# Patient Record
Sex: Male | Born: 1945 | Race: White | Hispanic: No | Smoking: Former smoker
Health system: Southern US, Community
[De-identification: ages and names within clinical notes are randomized; demographics above are authoritative.]

## PROBLEM LIST (undated history)

## (undated) DIAGNOSIS — C801 Malignant (primary) neoplasm, unspecified: Secondary | ICD-10-CM

## (undated) DIAGNOSIS — I10 Essential (primary) hypertension: Secondary | ICD-10-CM

## (undated) DIAGNOSIS — I509 Heart failure, unspecified: Secondary | ICD-10-CM

## (undated) DIAGNOSIS — Z9989 Dependence on other enabling machines and devices: Secondary | ICD-10-CM

## (undated) DIAGNOSIS — F1011 Alcohol abuse, in remission: Secondary | ICD-10-CM

## (undated) DIAGNOSIS — R269 Unspecified abnormalities of gait and mobility: Secondary | ICD-10-CM

## (undated) DIAGNOSIS — E669 Obesity, unspecified: Secondary | ICD-10-CM

## (undated) DIAGNOSIS — I499 Cardiac arrhythmia, unspecified: Secondary | ICD-10-CM

## (undated) DIAGNOSIS — M1990 Unspecified osteoarthritis, unspecified site: Secondary | ICD-10-CM

## (undated) DIAGNOSIS — M545 Low back pain: Principal | ICD-10-CM

## (undated) DIAGNOSIS — N189 Chronic kidney disease, unspecified: Secondary | ICD-10-CM

## (undated) DIAGNOSIS — G4733 Obstructive sleep apnea (adult) (pediatric): Secondary | ICD-10-CM

## (undated) DIAGNOSIS — G8929 Other chronic pain: Principal | ICD-10-CM

## (undated) DIAGNOSIS — I350 Nonrheumatic aortic (valve) stenosis: Secondary | ICD-10-CM

## (undated) DIAGNOSIS — M21371 Foot drop, right foot: Secondary | ICD-10-CM

## (undated) DIAGNOSIS — E119 Type 2 diabetes mellitus without complications: Secondary | ICD-10-CM

## (undated) DIAGNOSIS — I4891 Unspecified atrial fibrillation: Secondary | ICD-10-CM

## (undated) DIAGNOSIS — E785 Hyperlipidemia, unspecified: Secondary | ICD-10-CM

## (undated) DIAGNOSIS — E1142 Type 2 diabetes mellitus with diabetic polyneuropathy: Secondary | ICD-10-CM

## (undated) HISTORY — DX: Essential (primary) hypertension: I10

## (undated) HISTORY — PX: DG THUMB LEFT HAND: HXRAD1658

## (undated) HISTORY — DX: Foot drop, right foot: M21.371

## (undated) HISTORY — DX: Alcohol abuse, in remission: F10.11

## (undated) HISTORY — DX: Type 2 diabetes mellitus with diabetic polyneuropathy: E11.42

## (undated) HISTORY — DX: Other chronic pain: G89.29

## (undated) HISTORY — DX: Obesity, unspecified: E66.9

## (undated) HISTORY — DX: Unspecified atrial fibrillation: I48.91

## (undated) HISTORY — PX: TOE SURGERY: SHX1073

## (undated) HISTORY — DX: Obstructive sleep apnea (adult) (pediatric): G47.33

## (undated) HISTORY — DX: Type 2 diabetes mellitus without complications: E11.9

## (undated) HISTORY — DX: Unspecified abnormalities of gait and mobility: R26.9

## (undated) HISTORY — DX: Dependence on other enabling machines and devices: Z99.89

## (undated) HISTORY — DX: Low back pain: M54.5

## (undated) HISTORY — PX: TONSILLECTOMY: SUR1361

## (undated) HISTORY — DX: Hyperlipidemia, unspecified: E78.5

## (undated) HISTORY — PX: KNEE SURGERY: SHX244

---

## 2005-08-05 ENCOUNTER — Emergency Department (HOSPITAL_COMMUNITY): Admission: EM | Admit: 2005-08-05 | Discharge: 2005-08-05 | Payer: Self-pay | Admitting: Emergency Medicine

## 2005-08-06 ENCOUNTER — Ambulatory Visit (HOSPITAL_BASED_OUTPATIENT_CLINIC_OR_DEPARTMENT_OTHER): Admission: RE | Admit: 2005-08-06 | Discharge: 2005-08-06 | Payer: Self-pay | Admitting: *Deleted

## 2005-08-06 ENCOUNTER — Ambulatory Visit (HOSPITAL_COMMUNITY): Admission: RE | Admit: 2005-08-06 | Discharge: 2005-08-06 | Payer: Self-pay | Admitting: *Deleted

## 2006-02-16 ENCOUNTER — Ambulatory Visit (HOSPITAL_COMMUNITY): Admission: RE | Admit: 2006-02-16 | Discharge: 2006-02-16 | Payer: Self-pay | Admitting: Internal Medicine

## 2007-12-01 ENCOUNTER — Ambulatory Visit (HOSPITAL_COMMUNITY): Admission: RE | Admit: 2007-12-01 | Discharge: 2007-12-01 | Payer: Self-pay | Admitting: Family Medicine
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
## 2011-05-09 NOTE — Op Note (Signed)
NAME:  Alejandro Vaughn, ROTHE NO.:  [id number]   MEDICAL RECORD NO.:  [id number]          PATIENT TYPE:  AMB   LOCATION:  DSC                          FACILITY:  [facility]   PHYSICIAN:  Tennis Must Meyerdierks, M.D.DATE OF BIRTH:  1946/08/17   DATE OF PROCEDURE:  08/06/2005  DATE OF DISCHARGE:                                 OPERATIVE REPORT   PREOPERATIVE DIAGNOSIS:  Comminuted intra-articular fracture, proximal  phalanx left thumb.   POSTOPERATIVE DIAGNOSIS:  Comminuted intra-articular fracture, proximal  phalanx left thumb.   PROCEDURE:  Open reduction internal fixation, proximal phalanx left thumb.   SURGEON:  Lowell Bouton, M.D.   ANESTHESIA:  General.   OPERATIVE FINDINGS:  The patient had a comminuted fracture that was oblique  in the mid shaft of the proximal phalanx. There was an intra-articular  component into the MP joint.   PROCEDURE:  Under general anesthesia with a tourniquet on the left arm, the  left hand was prepped and draped in usual fashion; after explaining the  limb, the tourniquet was inflated to 250 mmHg. A longitudinal incision was  made dorsally over the thumb proximal phalanx. Sharp dissection was carried  through the subcutaneous tissues and bleeding points were coagulated. Sharp  dissection was carried down through the extensor tendon, which was split  longitudinally. Periosteum was elevated with a Administrator, [employer]  spring retractors were inserted. The fracture site was identified and a  curet was used to clean the ends of the fracture. The fracture was irrigated  and suctioned out. The oblique component of the fracture showed a fair  amount of comminution. The incision was then extended proximally to the MP  joint, to allow visualization of the joint. The fracture in the joint was  reduced and a 2.0-mm lag screw was lagged across the fracture from radial to  ulnar. X-rays showed good position of the articular fracture.  It appeared to  be anatomically reduced, looking under direct vision into the joint. The  oblique portion of the fracture was then reduced and held with a clamp, and  a plate from the modular handset (that measured a 2.0 mm) was applied to the  dorsum. Some of the holes were cut off, as the plate was too long. The  distal part of the plate had two screws transversely, and the plate was  contoured to fit the bone. Five screws were then inserted, with two in the  distal fragment and three in the proximal fragment; getting good fixation.  The 2.0 mm screws were used. X-rays showed good alignment. There was some  bone loss due to the comminution at the fracture. Under fluoroscopy, the  fracture was stable with range of motion of the MP and IP joint. The wound  was then irrigated copiously with saline.  Marcaine 0.5% was inserted into  the skin for pain control, and a digital block was performed. The extensor  tendon was repaired with a 4-0 Vicryl running suture. The subcutaneous  tissue was closed with 4-0 Vicryl, and the skin  with a 3-0 subcuticular Prolene. Steri-Strips were  applied, followed by  sterile dressings and a thumb spica splint. The tourniquet was released with  good circulation of the hand. The patient went to the recovery room awake  and stable, in good condition.      Lowell Bouton, M.D.  Electronically Signed     EMM/MEDQ  D:  08/06/2005  T:  08/07/2005  Job:  [id number]   cc:   Teola Bradley, M.D.  [phone] [address]  [place] [address]  Fax: [phone]

## 2014-12-22 DIAGNOSIS — I509 Heart failure, unspecified: Secondary | ICD-10-CM

## 2014-12-22 HISTORY — DX: Heart failure, unspecified: I50.9

## 2015-10-31 ENCOUNTER — Encounter: Payer: Self-pay | Admitting: Neurology

## 2015-10-31 ENCOUNTER — Ambulatory Visit (INDEPENDENT_AMBULATORY_CARE_PROVIDER_SITE_OTHER): Payer: Medicare Other | Admitting: Neurology

## 2015-10-31 VITALS — BP 98/64 | HR 70 | Ht 68.0 in

## 2015-10-31 DIAGNOSIS — E538 Deficiency of other specified B group vitamins: Secondary | ICD-10-CM

## 2015-10-31 DIAGNOSIS — M21371 Foot drop, right foot: Secondary | ICD-10-CM

## 2015-10-31 DIAGNOSIS — G8929 Other chronic pain: Secondary | ICD-10-CM | POA: Diagnosis not present

## 2015-10-31 DIAGNOSIS — R269 Unspecified abnormalities of gait and mobility: Secondary | ICD-10-CM

## 2015-10-31 DIAGNOSIS — E1142 Type 2 diabetes mellitus with diabetic polyneuropathy: Secondary | ICD-10-CM | POA: Diagnosis not present

## 2015-10-31 DIAGNOSIS — M545 Low back pain, unspecified: Secondary | ICD-10-CM

## 2015-10-31 HISTORY — DX: Foot drop, right foot: M21.371

## 2015-10-31 HISTORY — DX: Unspecified abnormalities of gait and mobility: R26.9

## 2015-10-31 HISTORY — DX: Low back pain, unspecified: M54.50

## 2015-10-31 HISTORY — DX: Type 2 diabetes mellitus with diabetic polyneuropathy: E11.42

## 2015-10-31 MED ORDER — GABAPENTIN 300 MG PO CAPS: ORAL_CAPSULE | ORAL | Status: DC
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
## 2015-10-31 NOTE — Patient Instructions (Addendum)
We will do EMG and NCV study on the legs and arms, and get blood work today. We will start gabapentin for the pain.   Peripheral Neuropathy Peripheral neuropathy is a type of nerve damage. It affects nerves that carry signals between the spinal cord and other parts of the body. These are called peripheral nerves. With peripheral neuropathy, one nerve or a group of nerves may be damaged.  CAUSES  Many things can damage peripheral nerves. For some people with peripheral neuropathy, the cause is unknown. Some causes include:  Diabetes. This is the most common cause of peripheral neuropathy.  Injury to a nerve.  Pressure or stress on a nerve that lasts a long time.  Too little vitamin B. Alcoholism can lead to this.  Infections.  Autoimmune diseases, such as multiple sclerosis and systemic lupus erythematosus.  Inherited nerve diseases.  Some medicines, such as cancer drugs.  Toxic substances, such as lead and mercury.  Too little blood flowing to the legs.  Kidney disease.  Thyroid disease. SIGNS AND SYMPTOMS  Different people have different symptoms. The symptoms you have will depend on which of your nerves is damaged. Common symptoms include:  Loss of feeling (numbness) in the feet and hands.  Tingling in the feet and hands.  Pain that burns.  Very sensitive skin.  Weakness.  Not being able to move a part of the body (paralysis).  Muscle twitching.  Clumsiness or poor coordination.  Loss of balance.  Not being able to control your bladder.  Feeling dizzy.  Sexual problems. DIAGNOSIS  Peripheral neuropathy is a symptom, not a disease. Finding the cause of peripheral neuropathy can be hard. To figure that out, your health care provider will take a medical history and do a physical exam. A neurological exam will also be done. This involves checking things affected by your brain, spinal cord, and nerves (nervous system). For example, your health care provider  will check your reflexes, how you move, and what you can feel.  Other types of tests may also be ordered, such as:  Blood tests.  A test of the fluid in your spinal cord.  Imaging tests, such as CT scans or an MRI.  Electromyography (EMG). This test checks the nerves that control muscles.  Nerve conduction velocity tests. These tests check how fast messages pass through your nerves.  Nerve biopsy. A small piece of nerve is removed. It is then checked under a microscope. TREATMENT   Medicine is often used to treat peripheral neuropathy. Medicines may include:  Pain-relieving medicines. Prescription or over-the-counter medicine may be suggested.  Antiseizure medicine. This may be used for pain.  Antidepressants. These also may help ease pain from neuropathy.  Lidocaine. This is a numbing medicine. You might wear a patch or be given a shot.  Mexiletine. This medicine is typically used to help control irregular heart rhythms.  Surgery. Surgery may be needed to relieve pressure on a nerve or to destroy a nerve that is causing pain.  Physical therapy to help movement.  Assistive devices to help movement. HOME CARE INSTRUCTIONS   Only take over-the-counter or prescription medicines as directed by your health care provider. Follow the instructions carefully for any given medicines. Do not take any other medicines without first getting approval from your health care provider.  If you have diabetes, work closely with your health care provider to keep your blood sugar under control.  If you have numbness in your feet:  Check every day for  signs of injury or infection. Watch for redness, warmth, and swelling.  Wear padded socks and comfortable shoes. These help protect your feet.  Do not do things that put pressure on your damaged nerve.  Do not smoke. Smoking keeps blood from getting to damaged nerves.  Avoid or limit alcohol. Too much alcohol can cause a lack of B vitamins.  These vitamins are needed for healthy nerves.  Develop a good support system. Coping with peripheral neuropathy can be stressful. Talk to a mental health specialist or join a support group if you are struggling.  Follow up with your health care provider as directed. SEEK MEDICAL CARE IF:   You have new signs or symptoms of peripheral neuropathy.  You are struggling emotionally from dealing with peripheral neuropathy.  You have a fever. SEEK IMMEDIATE MEDICAL CARE IF:   You have an injury or infection that is not healing.  You feel very dizzy or begin vomiting.  You have chest pain.  You have trouble breathing.   This information is not intended to replace advice given to you by your health care provider. Make sure you discuss any questions you have with your health care provider.   Document Released: 11/28/2002 Document Revised: 08/20/2011 Document Reviewed: 08/15/2013 Elsevier Interactive Patient Education [employer].

## 2015-11-02 LAB — ANGIOTENSIN CONVERTING ENZYME: Angio Convert Enzyme: <15 U/L (ref 14–82)

## 2015-11-02 LAB — MULTIPLE MYELOMA PANEL, SERUM
ALBUMIN SERPL ELPH-MCNC: 3.8 g/dL (ref 2.9–4.4)
ALPHA2 GLOB SERPL ELPH-MCNC: 0.9 g/dL (ref 0.4–1.0)
Albumin/Glob SerPl: 1.6 (ref 0.7–1.7)
Alpha 1: 0.2 g/dL (ref 0.0–0.4)
B-GLOBULIN SERPL ELPH-MCNC: 1 g/dL (ref 0.7–1.3)
Gamma Glob SerPl Elph-Mcnc: 0.4 g/dL (ref 0.4–1.8)
Globulin, Total: 2.5 g/dL (ref 2.2–3.9)
IGG (IMMUNOGLOBIN G), SERUM: 592 mg/dL — AB (ref 700–1600)
IGM (IMMUNOGLOBULIN M), SRM: 22 mg/dL (ref 20–172)
IgA/Immunoglobulin A, Serum: 160 mg/dL (ref 61–437)
TOTAL PROTEIN: 6.3 g/dL (ref 6.0–8.5)

## 2015-11-02 LAB — ANA W/REFLEX: Anti Nuclear Antibody(ANA): NEGATIVE

## 2015-11-02 LAB — VITAMIN B12: Vitamin B-12: 338 pg/mL (ref 211–946)

## 2015-11-02 LAB — RHEUMATOID FACTOR: Rhuematoid fact SerPl-aCnc: <10 IU/mL (ref 0.0–13.9)

## 2015-11-05 ENCOUNTER — Telehealth: Payer: Self-pay | Admitting: *Deleted

## 2015-11-05 MED ORDER — GABAPENTIN 100 MG PO CAPS: ORAL_CAPSULE | ORAL | Status: AC

## 2015-11-05 NOTE — Telephone Encounter (Signed)
I called the patient. The 300 mg gabapentin capsules were not tolerated. I will give him the 100 mg capsules to see if he can tolerate this dose.

## 2015-11-05 NOTE — Telephone Encounter (Addendum)
Called pt about unremarkable labs results. Pt verbalized understanding. He stated the Gabapentin made him very tired. He stopped taking medication Saturday 11/03/15. He had slept for three hours, had dinner and went back to sleep. He is taking diclofenac and that made him drowsy and a new blood thinner. He would like to try a different medication other than gabapentin.  I advised this is a side effect of gabapentin and I will let Dr Jannifer Franklin know and we will call him back. He verbalized understanding.

## 2015-11-05 NOTE — Telephone Encounter (Signed)
-----   Message from Kathrynn Ducking, MD sent at 11/03/2015  4:50 PM EST -----  The blood work results are unremarkable. Please call the patient.  ----- Message -----    From: [facility] Lab Results In Interface    Sent: 11/01/2015   7:45 AM      To: Kathrynn Ducking, MD

## 2015-12-12 ENCOUNTER — Ambulatory Visit (INDEPENDENT_AMBULATORY_CARE_PROVIDER_SITE_OTHER): Payer: Self-pay | Admitting: Neurology

## 2015-12-12 ENCOUNTER — Encounter: Payer: Self-pay | Admitting: Neurology

## 2015-12-12 ENCOUNTER — Ambulatory Visit (INDEPENDENT_AMBULATORY_CARE_PROVIDER_SITE_OTHER): Payer: Medicare Other | Admitting: Neurology

## 2015-12-12 DIAGNOSIS — E1142 Type 2 diabetes mellitus with diabetic polyneuropathy: Secondary | ICD-10-CM | POA: Diagnosis not present

## 2015-12-12 DIAGNOSIS — R269 Unspecified abnormalities of gait and mobility: Secondary | ICD-10-CM

## 2015-12-12 DIAGNOSIS — M21371 Foot drop, right foot: Secondary | ICD-10-CM

## 2015-12-12 DIAGNOSIS — G8929 Other chronic pain: Secondary | ICD-10-CM

## 2015-12-12 DIAGNOSIS — M545 Low back pain: Secondary | ICD-10-CM

## 2015-12-12 NOTE — Progress Notes (Signed)
Please refer to EMG and nerve conduction study procedure note. 

## 2015-12-12 NOTE — Procedures (Signed)
     HISTORY:  Alejandro Vaughn is a 69 year old gentleman with a history of significant obesity, diabetes, and prior history of alcohol abuse. The patient has had some chronic issues with low back pain, pain down the left greater than right lower extremities. The patient has a significant right-sided footdrop. He is being evaluated for a possible peripheral neuropathy or a lumbosacral radiculopathy.  NERVE CONDUCTION STUDIES:  Nerve conduction studies were performed on both upper extremities. The distal motor latencies for the median nerves were prolonged bilaterally, with normal motor amplitudes for these nerves bilaterally. The distal motor latencies and motor amplitudes for the ulnar nerves were normal bilaterally. The F wave latencies for the median nerves were slightly prolonged on the right, borderline normal on the left, and borderline normal for the right ulnar nerve and normal for the left ulnar nerve. The nerve conduction velocities for the median and ulnar nerves were normal bilaterally. The sensory latencies for the median nerves were prolonged bilaterally, normal for the ulnar nerves bilaterally.   Nerve conduction studies were performed on both lower extremities. No response was seen for the peroneal or posterior tibial nerves bilaterally. The H reflex latencies were unobtainable bilaterally, and the peroneal sensory latencies were unobtainable bilaterally.  EMG STUDIES:  EMG study was performed on the right lower extremity:  The tibialis anterior muscle reveals 1 to 2K motor units with markedly reduced recruitment. 2+ fibrillations and positive waves were seen. The peroneus tertius muscle reveals no voluntary motor units with no recruitment. No fibrillations or positive waves were seen. The medial gastrocnemius muscle reveals 1 to 4K motor units with decreased recruitment. 1+ fibrillations and positive waves were seen. The vastus lateralis muscle reveals up to 15K motor units with  markedly reduced recruitment. No fibrillations or positive waves were seen. The iliopsoas muscle reveals 2 to 4K motor units with full recruitment. No fibrillations or positive waves were seen. The biceps femoris muscle (long head) reveals 2 to 5K motor units with decreased recruitment. No fibrillations or positive waves were seen. The lumbosacral paraspinal muscles were tested at 3 levels, and revealed 2+ positive waves at all 3 levels tested. There was good relaxation.   IMPRESSION:  Nerve conduction studies done on all 4 extremities shows evidence of a peripheral neuropathy of significant severity, primarily axonal in nature. Bilateral mild carpal tunnel syndrome is seen. EMG evaluation of the right lower extremity shows evidence of distal and proximal chronic denervation and some distal acute denervation. These findings are in part related to the presence of the peripheral neuropathy, there may be an overlying L4  and L5 radiculopathy that is primarily chronic. Severe chronic denervation is seen in the peroneal nerve distribution on the right.  Jill Alexanders MD 12/12/2015 1:31 PM  [facility] [address] [place], [place] [address]  Phone [phone] Fax [phone]

## 2016-03-12 ENCOUNTER — Ambulatory Visit: Payer: Medicare Other | Admitting: Neurology

## 2016-03-26 ENCOUNTER — Encounter: Payer: Self-pay | Admitting: Neurology

## 2016-03-26 ENCOUNTER — Ambulatory Visit (INDEPENDENT_AMBULATORY_CARE_PROVIDER_SITE_OTHER): Payer: Medicare HMO | Admitting: Neurology

## 2016-03-26 ENCOUNTER — Telehealth: Payer: Self-pay | Admitting: Neurology

## 2016-03-26 VITALS — BP 132/84 | HR 84 | Ht 68.0 in

## 2016-03-26 DIAGNOSIS — E1142 Type 2 diabetes mellitus with diabetic polyneuropathy: Secondary | ICD-10-CM | POA: Diagnosis not present

## 2016-03-26 DIAGNOSIS — M545 Low back pain: Secondary | ICD-10-CM

## 2016-03-26 DIAGNOSIS — R269 Unspecified abnormalities of gait and mobility: Secondary | ICD-10-CM | POA: Diagnosis not present

## 2016-03-26 DIAGNOSIS — G8929 Other chronic pain: Secondary | ICD-10-CM

## 2016-03-26 DIAGNOSIS — M21371 Foot drop, right foot: Secondary | ICD-10-CM

## 2016-03-26 NOTE — Telephone Encounter (Signed)
Made change.

## 2016-03-26 NOTE — Progress Notes (Signed)
Reason for visit: Low back pain  Alejandro Vaughn is an 70 y.o. male  History of present illness:  Alejandro Vaughn is a 70 year old right-handed white male with a history of obesity, prior alcohol abuse, and lumbar spondylosis and spinal stenosis. The patient has a right-sided footdrop. EMG and nerve conduction study done previously shows evidence of a peripheral neuropathy associated with his diabetes, but he also has an overlying L5 radiculopathy that is chronic in nature. The patient has essentially no pain when he is lying down flat, but when he stands up he has severe back pain, he has limitation in his ability to ambulate, he can walk for about 100 feet without stopping. The patient may have difficulty with the right foot catching the toe, with the potential for falling. He has not had any falls since last seen. The has been seen previously by a pain center, a transforaminal epidural steroid injection was done at L5-S1 level without much benefit. He has not had any surgical evaluations within the last 10 years. He comes back to this office for an evaluation.  Past Medical History  Diagnosis Date  . Chronic low back pain 10/31/2015  . Abnormality of gait 10/31/2015  . A-fib ([place])   . Obese   . Diabetes ([place])   . Hypertension   . Dyslipidemia   . Diabetic peripheral neuropathy associated with type 2 diabetes mellitus ([place]) 10/31/2015  . OSA on CPAP   . Foot drop, right 10/31/2015  . History of alcohol abuse     Past Surgical History  Procedure Laterality Date  . Knee surgery Left     meniscus  . Toe surgery    . Tonsillectomy      Family History  Problem Relation Age of Onset  . Congestive Heart Failure Mother   . Diabetes Father   . Alcohol abuse Father   . Stroke Maternal Grandmother   . Alzheimer's disease Maternal Grandfather   . Heart attack Paternal Grandmother   . Diabetes Paternal Grandfather     Social history:  reports that he quit smoking about 37 years ago. He has  never used smokeless tobacco. He reports that he does not drink alcohol or use illicit drugs.    Allergies  Allergen Reactions  . Aspirin Swelling    Swelling in eyes  . Other Swelling    Shellfish, bee stings    Medications:  Prior to Admission medications   Medication Sig Start Date End Date Taking? Authorizing Provider  atenolol (TENORMIN) 50 MG tablet Take 1 tablet by mouth daily.   Yes Historical Provider, MD  atorvastatin (LIPITOR) 40 MG tablet Take 1 tablet by mouth daily. 01/04/15  Yes Historical Provider, MD  benazepril (LOTENSIN) 40 MG tablet Take 1 tablet by mouth daily. 09/27/14  Yes Historical Provider, MD  diclofenac (VOLTAREN) 75 MG EC tablet Take 75 mg by mouth 3 (three) times daily. 2 tabs in a.m; 1 tab @@ lunch; 1 tab in p.m 10/02/14  Yes Historical Provider, MD  diltiazem (CARTIA XT) 240 MG 24 hr capsule Take 1 capsule by mouth daily. 08/30/14  Yes Historical Provider, MD  ELIQUIS 5 MG TABS tablet Take 1 tablet by mouth 2 (two) times daily. 10/19/15  Yes Historical Provider, MD  furosemide (LASIX) 20 MG tablet Take 1 tablet by mouth daily. 09/27/14  Yes Historical Provider, MD  gabapentin (NEURONTIN) 100 MG capsule One capsule 3 times a day for 1 week, then take 2 capsules 3 times a day  11/05/15  Yes Kathrynn Ducking, MD  glipiZIDE (GLUCOTROL XL) 5 MG 24 hr tablet Take 5 mg by mouth daily. 09/26/15  Yes Historical Provider, MD  Magnesium Oxide 250 MG TABS Take 1 tablet by mouth daily. 09/06/14  Yes Historical Provider, MD  metFORMIN (GLUCOPHAGE) 1000 MG tablet Take 1 tablet by mouth 2 (two) times daily.   Yes Historical Provider, MD  potassium chloride (K-DUR,KLOR-CON) 10 MEQ tablet Take 1 tablet by mouth daily. 10/03/14  Yes Historical Provider, MD    ROS:  Out of a complete 14 system review of symptoms, the patient complains only of the following symptoms, and all other reviewed systems are negative.  Eye redness, blurred vision Joint pain, joint swelling, back pain,  walking difficulty  Blood pressure 132/84, pulse 84, height 5\' 8"  (1.727 m).  Physical Exam  General: The patient is alert and cooperative at the time of the examination. The patient is markedly obese.  Skin: 3+ edema below the knees are noted bilaterally.   Neurologic Exam  Mental status: The patient is alert and oriented x 3 at the time of the examination. The patient has apparent normal recent and remote memory, with an apparently normal attention span and concentration ability.   Cranial nerves: Facial symmetry is present. Speech is normal, no aphasia or dysarthria is noted. Extraocular movements are full. Visual fields are full.  Motor: The patient has good strength in all 4 extremities, with exception of a prominent right foot drop.  Sensory examination: Soft touch sensation is symmetric on the face, arms, and legs, with exception of some decrease in salt a sensation on the right leg below the knee.  Coordination: The patient has good finger-nose-finger and heel-to-shin bilaterally.  Gait and station: The patient has a wide-based, unsteady gait. Tandem gait was not attempted. Romberg is negative. The patient reports significant discomfort with standing.  Reflexes: Deep tendon reflexes are symmetric, but are depressed throughout.   Assessment/Plan:  1. Diabetes  2. Diabetic peripheral neuropathy  3. Lumbar spondylosis, spinal stenosis  4. Gait disturbance  The patient will be set up for a physical therapy evaluation for a possible AFO brace on the right. The patient may be given a trial with an epidural steroid injection, but he is on Eliquis, he will be seeing his cardiologist soon, he may determine whether he can come off of Eliquis for several days prior to the procedure. If the epidural is not effective, we may consider a surgical referral. Otherwise, he will follow-up in 5 or 6 months.  Jill Alexanders MD 03/26/2016 6:24 PM  [facility] [address] [place], [place] [address]  Phone [phone] Fax [phone]

## 2016-03-26 NOTE — Telephone Encounter (Signed)
Ex Wife Frenchie Klusman called to advise, visit summary received today from visit with Dr. Jannifer Franklin needs correction to doctor listed. Change from Dr. Atha Starks to Physician Assistant Marlowe Sax [phone].

## 2016-04-24 ENCOUNTER — Telehealth: Payer: Self-pay | Admitting: Neurology

## 2016-04-24 DIAGNOSIS — M48061 Spinal stenosis, lumbar region without neurogenic claudication: Secondary | ICD-10-CM

## 2016-04-24 NOTE — Telephone Encounter (Signed)
Pt called requesting referral to neurosurgeon. sts he is not getting any better,

## 2016-04-24 NOTE — Telephone Encounter (Signed)
The patient has called indicating that he is gaining very little improvement, he believes that his back is a significant issue for him. The last MRI was done 2-1/2 years ago. We will recheck a MRI of the low back. We may consider a surgical referral if appropriate. The patient does have some pain with walking, but he clearly has underlying issues with peripheral neuropathy that are also impaired in his ability to walk.

## 2016-05-06 ENCOUNTER — Ambulatory Visit
Admission: RE | Admit: 2016-05-06 | Discharge: 2016-05-06 | Disposition: A | Discharge status: Home / Self Care | Payer: Medicare HMO | Source: Ambulatory Visit | Attending: Neurology | Admitting: Neurology

## 2016-05-06 DIAGNOSIS — M48061 Spinal stenosis, lumbar region without neurogenic claudication: Secondary | ICD-10-CM

## 2016-05-20 ENCOUNTER — Ambulatory Visit
Admission: RE | Admit: 2016-05-20 | Discharge: 2016-05-20 | Disposition: A | Discharge status: Home / Self Care | Payer: Medicare HMO | Source: Ambulatory Visit | Attending: Neurology | Admitting: Neurology

## 2016-05-20 DIAGNOSIS — M4806 Spinal stenosis, lumbar region: Secondary | ICD-10-CM | POA: Diagnosis not present

## 2016-05-22 ENCOUNTER — Telehealth: Payer: Self-pay | Admitting: *Deleted

## 2016-05-22 ENCOUNTER — Telehealth: Payer: Self-pay | Admitting: Neurology

## 2016-05-22 DIAGNOSIS — M5416 Radiculopathy, lumbar region: Secondary | ICD-10-CM

## 2016-05-22 NOTE — Telephone Encounter (Signed)
Message For: OFFICE               Taken  1-JUN-17 at  3:34PM by BRB ------------------------------------------------------------ Alejandro Vaughn               CID [id number]  Patient Alejandro Vaughn         Pt's Dr Jannifer Franklin       Area Code 336 Phone# 10 [place] 05/23/1946      RE HAD A MISSED CALL, THINKS FOR RESULTS, PLS C/B                                                         Disp:Y/N N If Y = C/B If No Response In 18minutes ============================================================

## 2016-05-22 NOTE — Telephone Encounter (Signed)
I called patient. The MRI of the low back that show moderate spinal stenosis at L4-5 level, moderate to severe at L5-S1 level, by foraminal stenosis at both of these levels. May be able to do an epidural steroid injection again if the patient is able to come off of anticoagulant medications for about 5 days. If the pain continues, surgical considerations should be undertaken.   MRI lumbar 05/21/16:  IMPRESSION:  Abnormal MRI lumbar spine (without) demonstrating: 1. At L5-S1: disc bulging, facet hypertrophy, epidural lipomatosis, with moderate-severe spinal stenosis and severe biforaminal stenosis  2. At L4-5: disc bulging, facet hypertrophy and severe arthropathy, significant fluid in the left facet joint, with moderate spinal stenosis and severe biforaminal stenosis  3. At L3-4: disc bulging and facet hypertrophy with moderate right and mild left foraminal stenosis

## 2016-05-23 NOTE — Telephone Encounter (Signed)
Returned pt TC. Pt says that he received a detailed mssg from Dr. Jannifer Franklin re: MRI results. States he's had 3 epidural injections that have not been helpful so has decided to go ahead with the surgery option instead. Voiced appreciation for call.

## 2016-05-23 NOTE — Addendum Note (Signed)
Addended by: Margette Fast on: 05/23/2016 02:00 PM   Modules accepted: Orders

## 2016-05-23 NOTE — Telephone Encounter (Signed)
I called patient. I will go ahead and set him up to see neurosurgery here in town.

## 2016-05-30 ENCOUNTER — Telehealth: Payer: Self-pay | Admitting: Neurology

## 2016-05-30 NOTE — Telephone Encounter (Signed)
Dr. Jannifer Franklin place a referral to neurosurgery on 05/23/16.  Patient has been notified.

## 2016-05-30 NOTE — Telephone Encounter (Signed)
Patient called wanting to know why Dr. Jannifer Franklin referred him for physical therapy, thought he was going to be referred to Neurosurgery for back. States he did physical therapy at [facility] 2 years ago. Please call [phone].

## 2016-06-13 ENCOUNTER — Ambulatory Visit: Payer: Medicare HMO | Admitting: Rehabilitation

## 2016-10-02 ENCOUNTER — Other Ambulatory Visit: Payer: Self-pay | Admitting: Neurosurgery

## 2016-10-07 ENCOUNTER — Ambulatory Visit: Payer: Self-pay | Admitting: Neurology

## 2016-10-15 ENCOUNTER — Encounter (HOSPITAL_COMMUNITY)
Admission: RE | Admit: 2016-10-15 | Discharge: 2016-10-15 | Disposition: A | Discharge status: Home / Self Care | Payer: Medicare HMO | Source: Ambulatory Visit | Attending: Neurosurgery | Admitting: Neurosurgery

## 2016-10-15 ENCOUNTER — Encounter (HOSPITAL_COMMUNITY): Payer: Self-pay

## 2016-10-15 DIAGNOSIS — I130 Hypertensive heart and chronic kidney disease with heart failure and stage 1 through stage 4 chronic kidney disease, or unspecified chronic kidney disease: Secondary | ICD-10-CM | POA: Diagnosis not present

## 2016-10-15 DIAGNOSIS — E1122 Type 2 diabetes mellitus with diabetic chronic kidney disease: Secondary | ICD-10-CM | POA: Insufficient documentation

## 2016-10-15 DIAGNOSIS — E1142 Type 2 diabetes mellitus with diabetic polyneuropathy: Secondary | ICD-10-CM | POA: Insufficient documentation

## 2016-10-15 DIAGNOSIS — E785 Hyperlipidemia, unspecified: Secondary | ICD-10-CM | POA: Diagnosis not present

## 2016-10-15 DIAGNOSIS — G8929 Other chronic pain: Secondary | ICD-10-CM | POA: Insufficient documentation

## 2016-10-15 DIAGNOSIS — N189 Chronic kidney disease, unspecified: Secondary | ICD-10-CM | POA: Diagnosis not present

## 2016-10-15 DIAGNOSIS — E669 Obesity, unspecified: Secondary | ICD-10-CM | POA: Insufficient documentation

## 2016-10-15 DIAGNOSIS — M1990 Unspecified osteoarthritis, unspecified site: Secondary | ICD-10-CM | POA: Insufficient documentation

## 2016-10-15 DIAGNOSIS — Z01812 Encounter for preprocedural laboratory examination: Secondary | ICD-10-CM | POA: Insufficient documentation

## 2016-10-15 DIAGNOSIS — R269 Unspecified abnormalities of gait and mobility: Secondary | ICD-10-CM | POA: Diagnosis not present

## 2016-10-15 DIAGNOSIS — I509 Heart failure, unspecified: Secondary | ICD-10-CM | POA: Diagnosis not present

## 2016-10-15 DIAGNOSIS — M21371 Foot drop, right foot: Secondary | ICD-10-CM | POA: Diagnosis not present

## 2016-10-15 DIAGNOSIS — G4733 Obstructive sleep apnea (adult) (pediatric): Secondary | ICD-10-CM | POA: Diagnosis not present

## 2016-10-15 DIAGNOSIS — M545 Low back pain: Secondary | ICD-10-CM | POA: Insufficient documentation

## 2016-10-15 DIAGNOSIS — I4891 Unspecified atrial fibrillation: Secondary | ICD-10-CM | POA: Insufficient documentation

## 2016-10-15 DIAGNOSIS — I350 Nonrheumatic aortic (valve) stenosis: Secondary | ICD-10-CM | POA: Insufficient documentation

## 2016-10-15 HISTORY — DX: Nonrheumatic aortic (valve) stenosis: I35.0

## 2016-10-15 HISTORY — DX: Cardiac arrhythmia, unspecified: I49.9

## 2016-10-15 HISTORY — DX: Unspecified osteoarthritis, unspecified site: M19.90

## 2016-10-15 HISTORY — DX: Chronic kidney disease, unspecified: N18.9

## 2016-10-15 HISTORY — DX: Malignant (primary) neoplasm, unspecified: C80.1

## 2016-10-15 HISTORY — DX: Heart failure, unspecified: I50.9

## 2016-10-15 LAB — ABO/RH: ABO/RH(D): AB POS

## 2016-10-15 LAB — COMPREHENSIVE METABOLIC PANEL
ALBUMIN: 3.8 g/dL (ref 3.5–5.0)
ALT: 40 U/L (ref 17–63)
AST: 27 U/L (ref 15–41)
Alkaline Phosphatase: 23 U/L — ABNORMAL LOW (ref 38–126)
Anion gap: 9 (ref 5–15)
BUN: 28 mg/dL — AB (ref 6–20)
CHLORIDE: 107 mmol/L (ref 101–111)
CO2: 27 mmol/L (ref 22–32)
Calcium: 9.6 mg/dL (ref 8.9–10.3)
Creatinine, Ser: 1.19 mg/dL (ref 0.61–1.24)
GFR calc Af Amer: >60 mL/min (ref >60)
GFR calc non Af Amer: 60 mL/min — ABNORMAL LOW (ref >60)
GLUCOSE: 97 mg/dL (ref 65–99)
POTASSIUM: 4.6 mmol/L (ref 3.5–5.1)
Sodium: 143 mmol/L (ref 135–145)
Total Bilirubin: 0.5 mg/dL (ref 0.3–1.2)
Total Protein: 6.3 g/dL — ABNORMAL LOW (ref 6.5–8.1)

## 2016-10-15 LAB — CBC
HEMATOCRIT: 40.7 % (ref 39.0–52.0)
Hemoglobin: 13.4 g/dL (ref 13.0–17.0)
MCH: 30.9 pg (ref 26.0–34.0)
MCHC: 32.9 g/dL (ref 30.0–36.0)
MCV: 94 fL (ref 78.0–100.0)
Platelets: 263 10*3/uL (ref 150–400)
RBC: 4.33 MIL/uL (ref 4.22–5.81)
RDW: 13.5 % (ref 11.5–15.5)
WBC: 9.3 10*3/uL (ref 4.0–10.5)

## 2016-10-15 LAB — GLUCOSE, CAPILLARY: Glucose-Capillary: 141 mg/dL — ABNORMAL HIGH (ref 65–99)

## 2016-10-15 LAB — TYPE AND SCREEN
ABO/RH(D): AB POS
Antibody Screen: NEGATIVE

## 2016-10-15 LAB — PROTIME-INR
INR: 1.07
Prothrombin Time: 13.9 seconds (ref 11.4–15.2)

## 2016-10-15 LAB — SURGICAL PCR SCREEN
MRSA, PCR: NEGATIVE
STAPHYLOCOCCUS AUREUS: NEGATIVE

## 2016-10-15 NOTE — Progress Notes (Signed)
Pt.'s care is in [place], [place], Cardiac- Dr. Maryruth Bun-, PCP- Dr. Levada Dy, Pulm.- unsure of name but he is followed for sleep apnea, & uses CPAP every night & states that all his doctors have given clearance for surgery. Call to [facility] with  Dr. Martie Round office & requested that she send the EKG, last ov note, stress test & Echo via fax to [facility]. Call to [facility] at Dr. Windy Carina office, she reports the last HgbA1c was done on 09/18/2016- 6.5. She will fax the record that she has.

## 2016-10-15 NOTE — Pre-Procedure Instructions (Signed)
Alejandro Vaughn  10/15/2016      [facility] [place], [place]. [place] [address] Phone: [phone] Fax: [phone]    Your procedure is scheduled on 10/20/2016.  Report to [facility] Admitting at 10:00 A.M.  Call this number if you have problems the morning of surgery:  [phone]   Remember:  Do not eat food or drink liquids after midnight.  ON SUNDAY   Take these medicines the morning of surgery with A SIP OF WATER :  Atenolol, Diltiazem, Gabapentin   Do not wear jewelry    Do not wear lotions, powders, or perfumes, or deoderant.    Men may shave face and neck.   Do not bring valuables to the hospital.   [facility] is not responsible for any belongings or valuables.  Contacts, dentures or bridgework may not be worn into surgery.  Leave your suitcase in the car.  After surgery it may be brought to your room.  For patients admitted to the hospital, discharge time will be determined by your treatment team.  Patients discharged the day of surgery will not be allowed to drive home.   Name and phone number of your driver:   With Friend   Special instructions:     How to Manage Your Diabetes Before and After Surgery  Why is it important to control my blood sugar before and after surgery? . Improving blood sugar levels before and after surgery helps healing and can limit problems. . A way of improving blood sugar control is eating a healthy diet by: o  Eating less sugar and carbohydrates o  Increasing activity/exercise o  Talking with your doctor about reaching your blood sugar goals . High blood sugars (greater than 180 mg/dL) can raise your risk of infections and slow your recovery, so you will need to focus on controlling your diabetes during the weeks before surgery. . Make sure that the doctor who takes care of your diabetes knows about your planned surgery including the date  and location.  How do I manage my blood sugar before surgery? . Check your blood sugar at least 4 times a day, starting 2 days before surgery, to make sure that the level is not too high or low. o Check your blood sugar the morning of your surgery when you wake up and every 2 hours until you get to the Short Stay unit. . If your blood sugar is less than 70 mg/dL, you will need to treat for low blood sugar: o Do not take insulin. o Treat a low blood sugar (less than 70 mg/dL) with  cup of clear juice (cranberry or apple), 4 glucose tablets, OR glucose gel. o Recheck blood sugar in 15 minutes after treatment (to make sure it is greater than 70 mg/dL). If your blood sugar is not greater than 70 mg/dL on recheck, call [phone] for further instructions. . Report your blood sugar to the short stay nurse when you get to Short Stay.  . If you are admitted to the hospital after surgery: o Your blood sugar will be checked by the staff and you will probably be given insulin after surgery (instead of oral diabetes medicines) to make sure you have good blood sugar levels. o The goal for blood sugar control after surgery is 80-180 mg/dL.              WHAT DO I DO  ABOUT MY DIABETES MEDICATION?   Marland Kitchen Do not take oral diabetes medicines (pills) the morning of surgery.  Marland Kitchen          Other Instructions:Special Instructions: [place] - Preparing for Surgery  Before surgery, you can play an important role.  Because skin is not sterile, your skin needs to be as free of germs as possible.  You can reduce the number of germs on you skin by washing with CHG (chlorahexidine gluconate) soap before surgery.  CHG is an antiseptic cleaner which kills germs and bonds with the skin to continue killing germs even after washing.  Please DO NOT use if you have an allergy to CHG or antibacterial soaps.  If your skin becomes reddened/irritated stop using the CHG and inform your nurse when you arrive at  Short Stay.  Do not shave (including legs and underarms) for at least 48 hours prior to the first CHG shower.  You may shave your face.  Please follow these instructions carefully:   1.  Shower with CHG Soap the night before surgery and the  morning of Surgery.  2.  If you choose to wash your hair, wash your hair first as usual with your  normal shampoo.  3.  After you shampoo, rinse your hair and body thoroughly to remove the  Shampoo.  4.  Use CHG as you would any other liquid soap.  You can apply chg directly to the skin and wash gently with scrungie or a clean washcloth.  5.  Apply the CHG Soap to your body ONLY FROM THE NECK DOWN.    Do not use on open wounds or open sores.  Avoid contact with your eyes, ears, mouth and genitals (private parts).  Wash genitals (private parts)   with your normal soap.  6.  Wash thoroughly, paying special attention to the area where your surgery will be performed.  7.  Thoroughly rinse your body with warm water from the neck down.  8.  DO NOT shower/wash with your normal soap after using and rinsing off   the CHG Soap.  9.  Pat yourself dry with a clean towel.            10.  Wear clean pajamas.            11.  Place clean sheets on your bed the night of your first shower and do not sleep with pets.  Day of Surgery  Do not apply any lotions/deodorants the morning of surgery.  Please wear clean clothes to the hospital/surgery center.          Patient Signature:  Date:   Nurse Signature:  Date:   Reviewed and Endorsed by [facility] Patient Education Committee, August 2015  Please read over the following fact sheets that you were given. Pain Booklet, Coughing and Deep Breathing, MRSA Information and Surgical Site Infection Prevention

## 2016-10-16 NOTE — Progress Notes (Addendum)
Anesthesia Chart Review:  Pt is a 70 year old male scheduled for L4-5 PLIF, possible L3-4 on 10/20/2016 with Kary Kos, MD.   - PCP is Marlowe Sax, PA at [facility].  - Cardiologist is Leslye Peer, MD at [facility], who has cleared pt for surgery.  Last office visit 08/18/16.    PMH includes:  CHF, moderate aortic stenosis, atrial fibrillation, HTN, DM, hyperlipidemia, OSA, CKD, hx alcohol abuse. Former smoker. BMI 40  Medications include: Atenolol, Lipitor, benazepril, diltiazem, Eliquis, Lasix, glipizide, metformin, potassium. Last dose eliquis 10/14/16.   Preoperative labs reviewed. HgbA1c was 6.5 on 09/15/16 at PCP's office.    EKG 04/01/16: Atrial fibrillation (84 bpm). LAD. Inferior infarct, age undetermined.  Echo 07/30/16: 1. LV size is normal. There is moderate concentric LVH. Overall LV systolic function is low-normal with an EF 50-55%. 2. RV is moderately enlarged. Global wall thickness of RV is moderately enlarged. The RV systolic function is mildly impaired. 3. LA is mildly dilated by volume. 4. The RA is markedly enlarged. 5. Moderate aortic stenosis with peak pressure gradient 36.37 mmHg, mean pressure gradient 24.10 mmHg. Aortic valve area is 1.4 cm. 6. There is a small, generalized pericardial effusion present. There is no evidence of cardiac temp not. 7. Compared to prior echo 06/2015, RV larger and RV function now impaired; AS worse; ascending aorta appears normal in size  Nuclear stress test 10/18/15: 1. Mild inferior wall defect present on rest and stress slightly more prominent on stress imaging but there is also motion artifact on the stress imaging. Mild ischemia cannot be excluded. 2. Mild septal hypokinesia. EF 53%.  If no changes, I anticipate pt can proceed with surgery as scheduled.   Willeen Cass, FNP-BC [facility] Short Stay Surgical Center/Anesthesiology Phone: [phone] 10/17/2016 12:57 PM

## 2016-10-17 ENCOUNTER — Encounter (HOSPITAL_COMMUNITY): Payer: Self-pay

## 2016-10-19 MED ORDER — DEXTROSE 5 % IV SOLN
3.0000 g | INTRAVENOUS | Status: AC
  Administered 2016-10-20: 3 g via INTRAVENOUS
  Filled 2016-10-19: qty 3000

## 2016-10-20 ENCOUNTER — Inpatient Hospital Stay (HOSPITAL_COMMUNITY)
Admission: RE | Admit: 2016-10-20 | Discharge: 2016-10-21 | DRG: 454 | Disposition: A | Discharge status: Home / Self Care | Payer: Medicare HMO | Source: Ambulatory Visit | Attending: Neurosurgery | Admitting: Neurosurgery

## 2016-10-20 ENCOUNTER — Encounter (HOSPITAL_COMMUNITY): Payer: Self-pay | Admitting: *Deleted

## 2016-10-20 ENCOUNTER — Inpatient Hospital Stay (HOSPITAL_COMMUNITY): Payer: Medicare HMO | Admitting: Vascular Surgery

## 2016-10-20 ENCOUNTER — Encounter (HOSPITAL_COMMUNITY)
Admission: RE | Disposition: A | Discharge status: Home / Self Care | Payer: Self-pay | Source: Ambulatory Visit | Attending: Neurosurgery

## 2016-10-20 ENCOUNTER — Inpatient Hospital Stay (HOSPITAL_COMMUNITY): Payer: Medicare HMO

## 2016-10-20 ENCOUNTER — Inpatient Hospital Stay (HOSPITAL_COMMUNITY): Payer: Medicare HMO | Admitting: Anesthesiology

## 2016-10-20 DIAGNOSIS — M48061 Spinal stenosis, lumbar region without neurogenic claudication: Secondary | ICD-10-CM | POA: Diagnosis present

## 2016-10-20 DIAGNOSIS — I130 Hypertensive heart and chronic kidney disease with heart failure and stage 1 through stage 4 chronic kidney disease, or unspecified chronic kidney disease: Secondary | ICD-10-CM | POA: Diagnosis present

## 2016-10-20 DIAGNOSIS — Z833 Family history of diabetes mellitus: Secondary | ICD-10-CM

## 2016-10-20 DIAGNOSIS — Z7984 Long term (current) use of oral hypoglycemic drugs: Secondary | ICD-10-CM

## 2016-10-20 DIAGNOSIS — Z823 Family history of stroke: Secondary | ICD-10-CM

## 2016-10-20 DIAGNOSIS — Z87891 Personal history of nicotine dependence: Secondary | ICD-10-CM

## 2016-10-20 DIAGNOSIS — M21371 Foot drop, right foot: Secondary | ICD-10-CM | POA: Diagnosis present

## 2016-10-20 DIAGNOSIS — I509 Heart failure, unspecified: Secondary | ICD-10-CM | POA: Diagnosis present

## 2016-10-20 DIAGNOSIS — E669 Obesity, unspecified: Secondary | ICD-10-CM | POA: Diagnosis present

## 2016-10-20 DIAGNOSIS — E785 Hyperlipidemia, unspecified: Secondary | ICD-10-CM | POA: Diagnosis present

## 2016-10-20 DIAGNOSIS — N183 Chronic kidney disease, stage 3 (moderate): Secondary | ICD-10-CM | POA: Diagnosis present

## 2016-10-20 DIAGNOSIS — I350 Nonrheumatic aortic (valve) stenosis: Secondary | ICD-10-CM | POA: Diagnosis present

## 2016-10-20 DIAGNOSIS — Z79899 Other long term (current) drug therapy: Secondary | ICD-10-CM

## 2016-10-20 DIAGNOSIS — M5116 Intervertebral disc disorders with radiculopathy, lumbar region: Secondary | ICD-10-CM | POA: Diagnosis present

## 2016-10-20 DIAGNOSIS — Z6841 Body Mass Index (BMI) 40.0 and over, adult: Secondary | ICD-10-CM

## 2016-10-20 DIAGNOSIS — M4316 Spondylolisthesis, lumbar region: Principal | ICD-10-CM | POA: Diagnosis present

## 2016-10-20 DIAGNOSIS — E1142 Type 2 diabetes mellitus with diabetic polyneuropathy: Secondary | ICD-10-CM | POA: Diagnosis present

## 2016-10-20 DIAGNOSIS — G4733 Obstructive sleep apnea (adult) (pediatric): Secondary | ICD-10-CM | POA: Diagnosis present

## 2016-10-20 DIAGNOSIS — Z7901 Long term (current) use of anticoagulants: Secondary | ICD-10-CM

## 2016-10-20 DIAGNOSIS — Z820 Family history of epilepsy and other diseases of the nervous system: Secondary | ICD-10-CM | POA: Diagnosis not present

## 2016-10-20 DIAGNOSIS — E1122 Type 2 diabetes mellitus with diabetic chronic kidney disease: Secondary | ICD-10-CM | POA: Diagnosis present

## 2016-10-20 DIAGNOSIS — Z419 Encounter for procedure for purposes other than remedying health state, unspecified: Secondary | ICD-10-CM

## 2016-10-20 DIAGNOSIS — Z8249 Family history of ischemic heart disease and other diseases of the circulatory system: Secondary | ICD-10-CM | POA: Diagnosis not present

## 2016-10-20 DIAGNOSIS — I4891 Unspecified atrial fibrillation: Secondary | ICD-10-CM | POA: Diagnosis present

## 2016-10-20 LAB — POCT I-STAT 7, (LYTES, BLD GAS, ICA,H+H)
Acid-base deficit: 1 mmol/L (ref 0.0–2.0)
Bicarbonate: 27.2 mmol/L (ref 20.0–28.0)
CALCIUM ION: 1.2 mmol/L (ref 1.15–1.40)
HEMATOCRIT: 38 % — AB (ref 39.0–52.0)
HEMOGLOBIN: 12.9 g/dL — AB (ref 13.0–17.0)
O2 SAT: 100 %
POTASSIUM: 4.6 mmol/L (ref 3.5–5.1)
Patient temperature: 37
SODIUM: 141 mmol/L (ref 135–145)
TCO2: 29 mmol/L (ref 0–100)
pCO2 arterial: 58 mmHg — ABNORMAL HIGH (ref 32.0–48.0)
pH, Arterial: 7.279 — ABNORMAL LOW (ref 7.350–7.450)
pO2, Arterial: 215 mmHg — ABNORMAL HIGH (ref 83.0–108.0)

## 2016-10-20 LAB — GLUCOSE, CAPILLARY
GLUCOSE-CAPILLARY: 119 mg/dL — AB (ref 65–99)
GLUCOSE-CAPILLARY: 166 mg/dL — AB (ref 65–99)
GLUCOSE-CAPILLARY: 187 mg/dL — AB (ref 65–99)

## 2016-10-20 SURGERY — POSTERIOR LUMBAR FUSION 2 LEVEL
Anesthesia: General | Site: Back

## 2016-10-20 MED ORDER — ARTIFICIAL TEARS OP OINT
TOPICAL_OINTMENT | OPHTHALMIC | Status: DC | PRN
  Administered 2016-10-20: 1 via OPHTHALMIC

## 2016-10-20 MED ORDER — EPHEDRINE SULFATE 50 MG/ML IJ SOLN
INTRAMUSCULAR | Status: DC | PRN
  Administered 2016-10-20 (×3): 10 mg via INTRAVENOUS

## 2016-10-20 MED ORDER — ACETAMINOPHEN 650 MG RE SUPP: 650.0000 mg | RECTAL | Status: DC | PRN

## 2016-10-20 MED ORDER — SUCCINYLCHOLINE CHLORIDE 200 MG/10ML IV SOSY
PREFILLED_SYRINGE | INTRAVENOUS | Status: AC
  Filled 2016-10-20: qty 10

## 2016-10-20 MED ORDER — THROMBIN 20000 UNITS EX SOLR
CUTANEOUS | Status: DC | PRN
  Administered 2016-10-20: 13:00:00 via TOPICAL

## 2016-10-20 MED ORDER — VANCOMYCIN HCL 1000 MG IV SOLR
INTRAVENOUS | Status: AC
  Filled 2016-10-20: qty 1000

## 2016-10-20 MED ORDER — DEXAMETHASONE SODIUM PHOSPHATE 10 MG/ML IJ SOLN
INTRAMUSCULAR | Status: AC
  Filled 2016-10-20: qty 1

## 2016-10-20 MED ORDER — ONDANSETRON HCL 4 MG/2ML IJ SOLN: 4.0000 mg | INTRAMUSCULAR | Status: DC | PRN

## 2016-10-20 MED ORDER — CEFAZOLIN SODIUM 10 G IJ SOLR
3.0000 g | Freq: Three times a day (TID) | INTRAMUSCULAR | Status: DC
  Administered 2016-10-20 – 2016-10-21 (×2): 3 g via INTRAVENOUS
  Filled 2016-10-20 (×5): qty 3000

## 2016-10-20 MED ORDER — BUPIVACAINE HCL (PF) 0.25 % IJ SOLN
INTRAMUSCULAR | Status: AC
  Filled 2016-10-20: qty 30

## 2016-10-20 MED ORDER — PHENYLEPHRINE HCL 10 MG/ML IJ SOLN
INTRAMUSCULAR | Status: DC | PRN
  Administered 2016-10-20 (×9): 80 ug via INTRAVENOUS

## 2016-10-20 MED ORDER — VANCOMYCIN HCL 1000 MG IV SOLR
INTRAVENOUS | Status: DC | PRN
  Administered 2016-10-20: 1000 mg via TOPICAL

## 2016-10-20 MED ORDER — LACTATED RINGERS IV SOLN
INTRAVENOUS | Status: DC | PRN
  Administered 2016-10-20 (×2): via INTRAVENOUS

## 2016-10-20 MED ORDER — BUPIVACAINE LIPOSOME 1.3 % IJ SUSP
20.0000 mL | INTRAMUSCULAR | Status: DC
  Filled 2016-10-20: qty 20

## 2016-10-20 MED ORDER — OXYCODONE-ACETAMINOPHEN 5-325 MG PO TABS: 1.0000 | ORAL_TABLET | ORAL | Status: DC | PRN

## 2016-10-20 MED ORDER — ATENOLOL 25 MG PO TABS
50.0000 mg | ORAL_TABLET | Freq: Every day | ORAL | Status: DC
  Filled 2016-10-20: qty 2

## 2016-10-20 MED ORDER — TETRAHYDROZOLINE HCL 0.05 % OP SOLN
2.0000 [drp] | Freq: Every day | OPHTHALMIC | Status: DC | PRN
Start: 2016-10-20 — End: 2016-10-21

## 2016-10-20 MED ORDER — SODIUM CHLORIDE 0.9 % IV SOLN
250.0000 mL | INTRAVENOUS | Status: DC
  Administered 2016-10-20: 1000 mL via INTRAVENOUS

## 2016-10-20 MED ORDER — ACETAMINOPHEN 325 MG PO TABS: 650.0000 mg | ORAL_TABLET | ORAL | Status: DC | PRN

## 2016-10-20 MED ORDER — DOCUSATE SODIUM 100 MG PO CAPS
100.0000 mg | ORAL_CAPSULE | Freq: Two times a day (BID) | ORAL | Status: DC
  Administered 2016-10-20 – 2016-10-21 (×2): 100 mg via ORAL
  Filled 2016-10-20 (×2): qty 1

## 2016-10-20 MED ORDER — MENTHOL 3 MG MT LOZG: 1.0000 | LOZENGE | OROMUCOSAL | Status: DC | PRN

## 2016-10-20 MED ORDER — THROMBIN 20000 UNITS EX SOLR
CUTANEOUS | Status: AC
  Filled 2016-10-20: qty 20000

## 2016-10-20 MED ORDER — SODIUM CHLORIDE 0.9% FLUSH: 3.0000 mL | INTRAVENOUS | Status: DC | PRN

## 2016-10-20 MED ORDER — SODIUM CHLORIDE 0.9% FLUSH
3.0000 mL | Freq: Two times a day (BID) | INTRAVENOUS | Status: DC
  Administered 2016-10-20: 3 mL via INTRAVENOUS

## 2016-10-20 MED ORDER — GABAPENTIN 100 MG PO CAPS
100.0000 mg | ORAL_CAPSULE | Freq: Four times a day (QID) | ORAL | Status: DC
  Administered 2016-10-20 – 2016-10-21 (×3): 100 mg via ORAL
  Filled 2016-10-20 (×3): qty 1

## 2016-10-20 MED ORDER — ONDANSETRON HCL 4 MG/2ML IJ SOLN
INTRAMUSCULAR | Status: AC
  Filled 2016-10-20: qty 2

## 2016-10-20 MED ORDER — DICLOFENAC SODIUM 75 MG PO TBEC
75.0000 mg | DELAYED_RELEASE_TABLET | Freq: Four times a day (QID) | ORAL | Status: DC
  Administered 2016-10-20 – 2016-10-21 (×2): 75 mg via ORAL
  Filled 2016-10-20 (×5): qty 1

## 2016-10-20 MED ORDER — FENTANYL CITRATE (PF) 100 MCG/2ML IJ SOLN
INTRAMUSCULAR | Status: DC | PRN
  Administered 2016-10-20 (×4): 50 ug via INTRAVENOUS
  Administered 2016-10-20: 100 ug via INTRAVENOUS

## 2016-10-20 MED ORDER — POTASSIUM CHLORIDE CRYS ER 10 MEQ PO TBCR
10.0000 meq | EXTENDED_RELEASE_TABLET | Freq: Every day | ORAL | Status: DC
  Administered 2016-10-20 – 2016-10-21 (×2): 10 meq via ORAL
  Filled 2016-10-20 (×2): qty 1

## 2016-10-20 MED ORDER — LIDOCAINE-EPINEPHRINE 1 %-1:100000 IJ SOLN
INTRAMUSCULAR | Status: AC
  Filled 2016-10-20: qty 1

## 2016-10-20 MED ORDER — MIDAZOLAM HCL 5 MG/5ML IJ SOLN
INTRAMUSCULAR | Status: DC | PRN
  Administered 2016-10-20: 2 mg via INTRAVENOUS

## 2016-10-20 MED ORDER — CHLORHEXIDINE GLUCONATE CLOTH 2 % EX PADS
6.0000 | MEDICATED_PAD | Freq: Once | CUTANEOUS | Status: DC
Start: 2016-10-20 — End: 2016-10-20

## 2016-10-20 MED ORDER — SENNOSIDES-DOCUSATE SODIUM 8.6-50 MG PO TABS: 1.0000 | ORAL_TABLET | Freq: Every evening | ORAL | Status: DC | PRN

## 2016-10-20 MED ORDER — ONDANSETRON HCL 4 MG/2ML IJ SOLN
INTRAMUSCULAR | Status: DC | PRN
  Administered 2016-10-20: 4 mg via INTRAVENOUS

## 2016-10-20 MED ORDER — ATORVASTATIN CALCIUM 40 MG PO TABS
40.0000 mg | ORAL_TABLET | Freq: Every day | ORAL | Status: DC
  Administered 2016-10-20: 40 mg via ORAL
  Filled 2016-10-20: qty 1

## 2016-10-20 MED ORDER — SUGAMMADEX SODIUM 500 MG/5ML IV SOLN
INTRAVENOUS | Status: DC | PRN
  Administered 2016-10-20: 240 mg via INTRAVENOUS

## 2016-10-20 MED ORDER — ROCURONIUM BROMIDE 100 MG/10ML IV SOLN
INTRAVENOUS | Status: DC | PRN
  Administered 2016-10-20: 60 mg via INTRAVENOUS
  Administered 2016-10-20 (×3): 10 mg via INTRAVENOUS

## 2016-10-20 MED ORDER — PHENOL 1.4 % MT LIQD: 1.0000 | OROMUCOSAL | Status: DC | PRN

## 2016-10-20 MED ORDER — METFORMIN HCL 500 MG PO TABS
1000.0000 mg | ORAL_TABLET | Freq: Two times a day (BID) | ORAL | Status: DC
  Administered 2016-10-20 – 2016-10-21 (×2): 1000 mg via ORAL
  Filled 2016-10-20 (×2): qty 2

## 2016-10-20 MED ORDER — SODIUM CHLORIDE 0.9 % IR SOLN
Status: DC | PRN
  Administered 2016-10-20: 13:00:00

## 2016-10-20 MED ORDER — LIDOCAINE-EPINEPHRINE 1 %-1:100000 IJ SOLN
INTRAMUSCULAR | Status: DC | PRN
  Administered 2016-10-20: 10 mL via INTRADERMAL

## 2016-10-20 MED ORDER — PHENYLEPHRINE HCL 10 MG/ML IJ SOLN
INTRAVENOUS | Status: DC | PRN
  Administered 2016-10-20 (×2): 50 ug/min via INTRAVENOUS

## 2016-10-20 MED ORDER — 0.9 % SODIUM CHLORIDE (POUR BTL) OPTIME
TOPICAL | Status: DC | PRN
  Administered 2016-10-20: 1000 mL

## 2016-10-20 MED ORDER — GLIPIZIDE 5 MG PO TABS
10.0000 mg | ORAL_TABLET | Freq: Two times a day (BID) | ORAL | Status: DC
  Administered 2016-10-20: 10 mg via ORAL
  Filled 2016-10-20 (×2): qty 2

## 2016-10-20 MED ORDER — HYDROMORPHONE HCL 1 MG/ML IJ SOLN
0.2500 mg | INTRAMUSCULAR | Status: DC | PRN
  Administered 2016-10-20: 0.5 mg via INTRAVENOUS

## 2016-10-20 MED ORDER — HYDROMORPHONE HCL 1 MG/ML IJ SOLN
0.5000 mg | INTRAMUSCULAR | Status: DC | PRN
  Administered 2016-10-20 – 2016-10-21 (×3): 1 mg via INTRAVENOUS
  Filled 2016-10-20 (×3): qty 1

## 2016-10-20 MED ORDER — MIDAZOLAM HCL 2 MG/2ML IJ SOLN
INTRAMUSCULAR | Status: AC
Start: 2016-10-20 — End: 2016-10-20
  Filled 2016-10-20: qty 2

## 2016-10-20 MED ORDER — DEXAMETHASONE SODIUM PHOSPHATE 10 MG/ML IJ SOLN
10.0000 mg | INTRAMUSCULAR | Status: AC
  Administered 2016-10-20: 10 mg via INTRAVENOUS

## 2016-10-20 MED ORDER — MEPERIDINE HCL 25 MG/ML IJ SOLN: 6.2500 mg | INTRAMUSCULAR | Status: DC | PRN

## 2016-10-20 MED ORDER — CHLORHEXIDINE GLUCONATE CLOTH 2 % EX PADS: 6.0000 | MEDICATED_PAD | Freq: Once | CUTANEOUS | Status: DC

## 2016-10-20 MED ORDER — ROCURONIUM BROMIDE 10 MG/ML (PF) SYRINGE
PREFILLED_SYRINGE | INTRAVENOUS | Status: AC
  Filled 2016-10-20: qty 10

## 2016-10-20 MED ORDER — MAGNESIUM OXIDE 250 MG PO TABS: 250.0000 mg | ORAL_TABLET | Freq: Every day | ORAL | Status: DC

## 2016-10-20 MED ORDER — PROPOFOL 10 MG/ML IV BOLUS
INTRAVENOUS | Status: DC | PRN
  Administered 2016-10-20: 120 mg via INTRAVENOUS

## 2016-10-20 MED ORDER — DILTIAZEM HCL ER COATED BEADS 240 MG PO CP24
240.0000 mg | ORAL_CAPSULE | Freq: Every day | ORAL | Status: DC
  Filled 2016-10-20: qty 1

## 2016-10-20 MED ORDER — EPHEDRINE 5 MG/ML INJ
INTRAVENOUS | Status: AC
  Filled 2016-10-20: qty 10

## 2016-10-20 MED ORDER — PHENYLEPHRINE 40 MCG/ML (10ML) SYRINGE FOR IV PUSH (FOR BLOOD PRESSURE SUPPORT)
PREFILLED_SYRINGE | INTRAVENOUS | Status: AC
  Filled 2016-10-20: qty 10

## 2016-10-20 MED ORDER — ARTIFICIAL TEARS OP OINT
TOPICAL_OINTMENT | OPHTHALMIC | Status: AC
  Filled 2016-10-20: qty 3.5

## 2016-10-20 MED ORDER — LIDOCAINE 2% (20 MG/ML) 5 ML SYRINGE
INTRAMUSCULAR | Status: AC
Start: 2016-10-20 — End: 2016-10-20
  Filled 2016-10-20: qty 5

## 2016-10-20 MED ORDER — FENTANYL CITRATE (PF) 100 MCG/2ML IJ SOLN
INTRAMUSCULAR | Status: AC
  Filled 2016-10-20: qty 4

## 2016-10-20 MED ORDER — BUPIVACAINE LIPOSOME 1.3 % IJ SUSP
INTRAMUSCULAR | Status: DC | PRN
  Administered 2016-10-20: 20 mL

## 2016-10-20 MED ORDER — FENTANYL CITRATE (PF) 100 MCG/2ML IJ SOLN
INTRAMUSCULAR | Status: AC
  Filled 2016-10-20: qty 2

## 2016-10-20 MED ORDER — CYCLOBENZAPRINE HCL 10 MG PO TABS
10.0000 mg | ORAL_TABLET | Freq: Three times a day (TID) | ORAL | Status: DC | PRN
  Administered 2016-10-21: 10 mg via ORAL
  Filled 2016-10-20 (×2): qty 1

## 2016-10-20 MED ORDER — FUROSEMIDE 20 MG PO TABS
20.0000 mg | ORAL_TABLET | Freq: Every day | ORAL | Status: DC
  Administered 2016-10-21: 20 mg via ORAL
  Filled 2016-10-20: qty 1

## 2016-10-20 MED ORDER — ADULT MULTIVITAMIN W/MINERALS CH
1.0000 | ORAL_TABLET | Freq: Two times a day (BID) | ORAL | Status: DC
  Administered 2016-10-20 – 2016-10-21 (×2): 1 via ORAL
  Filled 2016-10-20 (×2): qty 1

## 2016-10-20 MED ORDER — SUCCINYLCHOLINE CHLORIDE 20 MG/ML IJ SOLN
INTRAMUSCULAR | Status: DC | PRN
  Administered 2016-10-20: 140 mg via INTRAVENOUS

## 2016-10-20 MED ORDER — MAGNESIUM OXIDE 400 (241.3 MG) MG PO TABS
200.0000 mg | ORAL_TABLET | Freq: Every day | ORAL | Status: DC
  Administered 2016-10-21: 200 mg via ORAL
  Filled 2016-10-20: qty 1

## 2016-10-20 MED ORDER — HYDROMORPHONE HCL 2 MG/ML IJ SOLN
INTRAMUSCULAR | Status: AC
  Filled 2016-10-20: qty 1

## 2016-10-20 MED ORDER — PROPOFOL 10 MG/ML IV BOLUS
INTRAVENOUS | Status: AC
  Filled 2016-10-20: qty 20

## 2016-10-20 MED ORDER — ONDANSETRON HCL 4 MG/2ML IJ SOLN: 4.0000 mg | Freq: Once | INTRAMUSCULAR | Status: DC | PRN

## 2016-10-20 MED ORDER — BENAZEPRIL HCL 20 MG PO TABS
40.0000 mg | ORAL_TABLET | Freq: Every day | ORAL | Status: DC
  Filled 2016-10-20: qty 2

## 2016-10-20 MED ORDER — THROMBIN 5000 UNITS EX SOLR
OROMUCOSAL | Status: DC | PRN
  Administered 2016-10-20: 13:00:00 via TOPICAL

## 2016-10-20 MED ORDER — THROMBIN 5000 UNITS EX SOLR
CUTANEOUS | Status: AC
  Filled 2016-10-20: qty 5000

## 2016-10-20 SURGICAL SUPPLY — 69 items
BAG DECANTER FOR FLEXI CONT (MISCELLANEOUS) ×3 IMPLANT
BENZOIN TINCTURE PRP APPL 2/3 (GAUZE/BANDAGES/DRESSINGS) ×3 IMPLANT
BLADE CLIPPER SURG (BLADE) IMPLANT
BLADE SURG 11 STRL SS (BLADE) ×3 IMPLANT
BUR MATCHSTICK NEURO 3.0 LAGG (BURR) ×3 IMPLANT
BUR PRECISION FLUTE 6.0 (BURR) ×3 IMPLANT
CAGE RISE 11-17-15 10X26 (Cage) ×6 IMPLANT
CANISTER SUCT 3000ML PPV (MISCELLANEOUS) ×3 IMPLANT
CAP LOCKING THREADED (Cap) ×12 IMPLANT
CLOSURE STERI-STRIP 1/2X4 (GAUZE/BANDAGES/DRESSINGS) ×1
CLOSURE WOUND 1/2 X4 (GAUZE/BANDAGES/DRESSINGS) ×2
CLSR STERI-STRIP ANTIMIC 1/2X4 (GAUZE/BANDAGES/DRESSINGS) ×2 IMPLANT
CONT SPEC 4OZ CLIKSEAL STRL BL (MISCELLANEOUS) ×3 IMPLANT
COVER BACK TABLE 60X90IN (DRAPES) ×3 IMPLANT
DECANTER SPIKE VIAL GLASS SM (MISCELLANEOUS) ×3 IMPLANT
DERMABOND ADVANCED (GAUZE/BANDAGES/DRESSINGS) ×2
DERMABOND ADVANCED .7 DNX12 (GAUZE/BANDAGES/DRESSINGS) ×1 IMPLANT
DRAPE C-ARM 42X72 X-RAY (DRAPES) ×6 IMPLANT
DRAPE C-ARMOR (DRAPES) IMPLANT
DRAPE HALF SHEET 40X57 (DRAPES) IMPLANT
DRAPE LAPAROTOMY 100X72X124 (DRAPES) ×3 IMPLANT
DRAPE POUCH INSTRU U-SHP 10X18 (DRAPES) ×3 IMPLANT
DRAPE SURG 17X23 STRL (DRAPES) ×3 IMPLANT
DRSG OPSITE 4X5.5 SM (GAUZE/BANDAGES/DRESSINGS) ×3 IMPLANT
DRSG OPSITE POSTOP 4X8 (GAUZE/BANDAGES/DRESSINGS) ×3 IMPLANT
DURAPREP 26ML APPLICATOR (WOUND CARE) ×3 IMPLANT
ELECT BLADE 4.0 EZ CLEAN MEGAD (MISCELLANEOUS) ×3
ELECT REM PT RETURN 9FT ADLT (ELECTROSURGICAL) ×3
ELECTRODE BLDE 4.0 EZ CLN MEGD (MISCELLANEOUS) ×1 IMPLANT
ELECTRODE REM PT RTRN 9FT ADLT (ELECTROSURGICAL) ×1 IMPLANT
EVACUATOR 3/16  PVC DRAIN (DRAIN) ×2
EVACUATOR 3/16 PVC DRAIN (DRAIN) ×1 IMPLANT
GAUZE SPONGE 4X4 12PLY STRL (GAUZE/BANDAGES/DRESSINGS) ×3 IMPLANT
GAUZE SPONGE 4X4 16PLY XRAY LF (GAUZE/BANDAGES/DRESSINGS) ×3 IMPLANT
GLOVE BIO SURGEON STRL SZ8 (GLOVE) ×9 IMPLANT
GLOVE BIO SURGEON STRL SZ8.5 (GLOVE) ×3 IMPLANT
GLOVE ECLIPSE 7.0 STRL STRAW (GLOVE) ×9 IMPLANT
GLOVE INDICATOR 7.5 STRL GRN (GLOVE) ×3 IMPLANT
GLOVE INDICATOR 8.5 STRL (GLOVE) ×6 IMPLANT
GOWN STRL REUS W/ TWL LRG LVL3 (GOWN DISPOSABLE) IMPLANT
GOWN STRL REUS W/ TWL XL LVL3 (GOWN DISPOSABLE) ×3 IMPLANT
GOWN STRL REUS W/TWL 2XL LVL3 (GOWN DISPOSABLE) IMPLANT
GOWN STRL REUS W/TWL LRG LVL3 (GOWN DISPOSABLE)
GOWN STRL REUS W/TWL XL LVL3 (GOWN DISPOSABLE) ×9
HEMOSTAT POWDER SURGIFOAM 1G (HEMOSTASIS) ×3 IMPLANT
KIT BASIN OR (CUSTOM PROCEDURE TRAY) ×3 IMPLANT
KIT INFUSE SMALL (Orthopedic Implant) ×3 IMPLANT
KIT ROOM TURNOVER OR (KITS) ×3 IMPLANT
MILL MEDIUM DISP (BLADE) ×3 IMPLANT
NEEDLE HYPO 21X1.5 SAFETY (NEEDLE) ×3 IMPLANT
NEEDLE HYPO 25X1 1.5 SAFETY (NEEDLE) ×3 IMPLANT
NS IRRIG 1000ML POUR BTL (IV SOLUTION) ×3 IMPLANT
PACK LAMINECTOMY NEURO (CUSTOM PROCEDURE TRAY) ×3 IMPLANT
PAD ARMBOARD 7.5X6 YLW CONV (MISCELLANEOUS) ×6 IMPLANT
PUTTY BONE DBX 5CC MIX (Putty) ×3 IMPLANT
ROD 40MM SPINAL (Rod) ×6 IMPLANT
SCREW SPINE CREO 6.5X50 (Screw) ×12 IMPLANT
SPONGE LAP 4X18 X RAY DECT (DISPOSABLE) IMPLANT
SPONGE SURGIFOAM ABS GEL 100 (HEMOSTASIS) ×3 IMPLANT
STRIP CLOSURE SKIN 1/2X4 (GAUZE/BANDAGES/DRESSINGS) ×4 IMPLANT
SUT VIC AB 0 CT1 18XCR BRD8 (SUTURE) ×1 IMPLANT
SUT VIC AB 0 CT1 8-18 (SUTURE) ×3
SUT VIC AB 2-0 CT1 18 (SUTURE) ×3 IMPLANT
SUT VIC AB 4-0 PS2 27 (SUTURE) ×3 IMPLANT
SYR 20CC LL (SYRINGE) ×3 IMPLANT
TOWEL OR 17X24 6PK STRL BLUE (TOWEL DISPOSABLE) ×3 IMPLANT
TOWEL OR 17X26 10 PK STRL BLUE (TOWEL DISPOSABLE) ×3 IMPLANT
TRAY FOLEY W/METER SILVER 16FR (SET/KITS/TRAYS/PACK) ×3 IMPLANT
WATER STERILE IRR 1000ML POUR (IV SOLUTION) ×3 IMPLANT

## 2016-10-20 NOTE — Op Note (Signed)
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  #2 posterior lumbar interbody fusion L4-5 utilizing the globus rise expandable cage system packed with locally harvested autograft mixed with DBX and BMP  #3 pedicle screw fixation utilizing the globus Creole amp pedicle screws with 6 5 x 50 mm screws bilaterally at L4 and L5  #4 posterior lateral arthrodesis L4-L5 utilizing the locally harvested autograft mixed with DBX and BMP  #5 open reduction spinal deformity  Surgeon: Dominica Severin Spike Desilets  Asst.: Newman Pies  Anesthesia: Gen.  EBL: Minimal  History of present illness: Patient is a 70 year old some is had chronic right footdrop progress worsening back and right greater than left leg pain workup revealed a dynamic spondylosis at L4-5 which will go from a grade 1 to grade 2 in flexion due to patient's failure conservative treatment progression of clinical syndrome and imaging findings and recommended decompression stabilization procedure at L4-5. I extensively went over the risks and benefits of the operation with the patient as well as perioperative course expectations of outcome and alternatives of surgery and he understands and agreed to proceed forward.  Operative procedure: Patient brought into the or was induced under general anesthesia on the [facility] table his back was prepped and draped in routine sterile fashion preoperative x-ray localize the appropriate level and also confirmed complete reduction of the spondylolisthesis just from positioning on the [place] table. So after infiltration of 10 mL lidocaine with epi a midline incision was made and Bovie light car was used  to take down subcutaneous tissues and subperiosteal dissections care lamina of L4 and L5 confirmed by interoperative x-ray. Then the spinous process of L4 was removed the facet joints were drilled down since decompression was begun complete medial facetectomies were performed there was marked instability and marked diastases of the facet joints there was severe hourglass compression of thecal sac predominantly from ligament hypertrophy but also a ruptured disc on the left. Marked compression of the 4 root both the L4 and L5 nerve roots were radically decompressed and the foramina were unroofed. Then disc space were incised and cleaned out using sequential distraction with an 11 distractor in place a selected 1017 expander cages packed with locally harvested autograft mixed with DBX mix and after adequate discectomy and input preparation inserted 1 cage cleaned out the opposite side packed the BMP DBX mix bone chips centrally packed the contralateral cage inserted. Expanded both cages sequentially up to approximately 12-13 mm in height. Then utilizing a high-speed drill pilot holes were drilled of the L4 and L5 pedicles bilaterally all pedicles were cannulated probed tapped with a 55 Probed again and 6 5 x 50 screws inserted bilaterally. All screws purchased posterior fluoroscopy confirmed good position of all implants. Then the was considered and fixing space was maintained aggressive decortication was care and lateral facets and TPs the remainder the local autograft mixed with BMP was packed posterior laterally then rods were anchored in placed and tightened down all the foramina reexpanded reexplored to confirm no migration of graft material Gelfoam was laid up the dura the fascia was injected with X Burrell thank powder was laid over the Gelfoam a drain was placed and the wounds closed in layers with after Vicryl or frank  powder any extrafascial space additional closure with interrupted Vicryl and a running 4  subcuticular. Dermabond benzo and Steri-Strips and sterile dressing was applied patient recovered in stable condition. At the end the case all needle counts sponge counts were correct.Marland Kitchen
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
## 2016-10-20 NOTE — Progress Notes (Signed)
Placed patient on CPAP set at 10cm with oxygen set at 3lpm

## 2016-10-20 NOTE — Progress Notes (Signed)
Patient accepted from PACU. A&O X4

## 2016-10-20 NOTE — Anesthesia Preprocedure Evaluation (Signed)
Anesthesia Evaluation  Patient identified by MRN, date of birth, ID band Patient awake    Reviewed: Allergy & Precautions, NPO status , Patient's Chart, lab work & pertinent test results  Airway Mallampati: I  TM Distance: >3 FB Neck ROM: Full    Dental   Pulmonary sleep apnea , former smoker,    Pulmonary exam normal        Cardiovascular hypertension, Normal cardiovascular exam+ dysrhythmias Atrial Fibrillation + Valvular Problems/Murmurs AS      Neuro/Psych    GI/Hepatic   Endo/Other  diabetes  Renal/GU      Musculoskeletal   Abdominal   Peds  Hematology   Anesthesia Other Findings   Reproductive/Obstetrics                             Anesthesia Physical Anesthesia Plan  ASA: III  Anesthesia Plan: General   Post-op Pain Management:    Induction: Intravenous  Airway Management Planned: Oral ETT  Additional Equipment:   Intra-op Plan:   Post-operative Plan: Extubation in OR  Informed Consent: I have reviewed the patients History and Physical, chart, labs and discussed the procedure including the risks, benefits and alternatives for the proposed anesthesia with the patient or authorized representative who has indicated his/her understanding and acceptance.     Plan Discussed with: CRNA and Surgeon  Anesthesia Plan Comments:         Anesthesia Quick Evaluation

## 2016-10-20 NOTE — Transfer of Care (Addendum)
Immediate Anesthesia Transfer of Care Note  Patient: Alejandro Vaughn  Procedure(s) Performed: Procedure(s) with comments: LUMBAR FOUR - LUMBAR FIVE POSTERIOR LUMBAR INTERBODY FUSION, POSSIBLE LUMBAR THREE - LUMBAR FOUR (N/A) - LUMBAR FOUR - LUMBAR FIVE POSTERIOR LUMBAR INTERBODY FUSION, POSSIBLE LUMBAR THREE - LUMBAR FOUR  Patient Location: PACU  Anesthesia Type:General  Level of Consciousness: awake and alert   Airway & Oxygen Therapy: Patient Spontanous Breathing and Patient connected to nasal cannula oxygen  Post-op Assessment: Report given to RN, Post -op Vital signs reviewed and stable and Patient moving all extremities  Post vital signs: Reviewed and stable  Last Vitals:  Vitals:   10/20/16 1006 10/20/16 1605  BP: (!) 153/104 112/75  Pulse: 87 90  Resp: 20 20  Temp: 36.8 C 36.4 C    Last Pain:  Vitals:   10/20/16 1605  TempSrc:   PainSc: (P) 0-No pain         Complications: No apparent anesthesia complications

## 2016-10-20 NOTE — Anesthesia Procedure Notes (Signed)
Procedure Name: Intubation Date/Time: 10/20/2016 12:24 PM Performed by: Scheryl Darter Pre-anesthesia Checklist: Patient identified, Emergency Drugs available, Suction available and Patient being monitored Patient Re-evaluated:Patient Re-evaluated prior to inductionOxygen Delivery Method: Circle System Utilized Preoxygenation: Pre-oxygenation with 100% oxygen Intubation Type: IV induction Ventilation: Mask ventilation without difficulty Tube type: Oral Number of attempts: 1 Airway Equipment and Method: Stylet and Oral airway Placement Confirmation: ETT inserted through vocal cords under direct vision,  positive ETCO2 and breath sounds checked- equal and bilateral Secured at: 23 cm Tube secured with: Tape Dental Injury: Teeth and Oropharynx as per pre-operative assessment  Difficulty Due To: Difficulty was anticipated, Difficult Airway- due to large tongue, Difficult Airway- due to reduced neck mobility and Difficult Airway- due to anterior larynx

## 2016-10-21 LAB — GLUCOSE, CAPILLARY
GLUCOSE-CAPILLARY: 154 mg/dL — AB (ref 65–99)
GLUCOSE-CAPILLARY: 162 mg/dL — AB (ref 65–99)

## 2016-10-21 MED ORDER — OXYCODONE-ACETAMINOPHEN 5-325 MG PO TABS: 1.0000 | ORAL_TABLET | ORAL | 0 refills | Status: DC | PRN

## 2016-10-21 MED ORDER — CYCLOBENZAPRINE HCL 10 MG PO TABS: 10.0000 mg | ORAL_TABLET | Freq: Three times a day (TID) | ORAL | 0 refills | Status: AC | PRN

## 2016-10-21 MED ORDER — SODIUM CHLORIDE 0.9 % IV BOLUS (SEPSIS)
500.0000 mL | Freq: Once | INTRAVENOUS | Status: AC
  Administered 2016-10-21: 500 mL via INTRAVENOUS

## 2016-10-21 MED FILL — Heparin Sodium (Porcine) Inj 1000 Unit/ML: INTRAMUSCULAR | Qty: 30 | Status: AC

## 2016-10-21 MED FILL — Sodium Chloride IV Soln 0.9%: INTRAVENOUS | Qty: 1000 | Status: AC

## 2016-10-21 NOTE — Discharge Instructions (Signed)
Spinal Fusion, Care After Refer to this sheet in the next few weeks. These instructions provide you with information on caring for yourself after your procedure. Your caregiver may also give you more specific instructions. Your treatment has been planned according to current medical practices, but problems sometimes occur. Call your caregiver if you have any problems or questions after your procedure. HOME CARE INSTRUCTIONS   Take whatever pain medicine has been prescribed by your caregiver. Do not take over-the-counter pain medicine unless directed otherwise by your caregiver.  Do not drive if you are taking narcotic pain medicines.  Change your bandage (dressing) if necessary or as directed by your caregiver.  Do not get your surgical cut (incision) wet. After a few days you may take quick showers (rather than baths), but keep your incision clean and dry. Covering the incision with plastic wrap while you shower should keep your incision dry. A few weeks after surgery, once your incision has healed and your caregiver says it is okay, you can take baths or go swimming.  If you have been prescribed medicine to prevent your blood from clotting, follow the directions carefully.  Check the area around your incision often. Look for redness and swelling. Also, look for anything leaking from your wound. You can use a mirror or have a family member inspect your incision if it is in a place where it is difficult for you to see.  Ask your caregiver what activities you should avoid and for how long.  Walk as much as possible.  Do not lift anything heavier than 10 pounds (4.5 kilograms) until your caregiver says it is safe.  Do not twist or bend for a few weeks. Try not to pull on things. Avoid sitting for long periods of time. Change positions at least every hour.  Ask your caregiver what kinds of exercise you should do to make your back stronger and when you should begin doing these exercises. SEEK  IMMEDIATE MEDICAL CARE IF:   Pain suddenly becomes much worse.  The incision area is red, swollen, bleeding, or leaking fluid.  Your legs or feet become increasingly painful, numb, weak, or swollen.  You have trouble controlling urination or bowel movements.  You have trouble breathing.  You have chest pain.  You have a fever. MAKE SURE YOU:  Understand these instructions.  Will watch your condition.  Will get help right away if you are not doing well or get worse.   This information is not intended to replace advice given to you by your health care provider. Make sure you discuss any questions you have with your health care provider.   Document Released: 06/27/2005 Document Revised: 12/29/2014 Document Reviewed: 05/23/2015 Elsevier Interactive Patient Education 2016 [employer]. No lifting no bending no twisting no driving a riding a car unless he is going home are coming back and forth to see me. Keep the incision clean dry and intact. May remove the outer dressing in 2-3 days leave the Steri-Strips on and intact. Cover the Steri-Strips with saran wrap for showers only.

## 2016-10-21 NOTE — Discharge Summary (Signed)
  Physician Discharge Summary  Patient ID: Alejandro Vaughn MRN: [id number] DOB/AGE: 1946-06-21 70 y.o.  Admit date: 10/20/2016 Discharge date: 10/21/2016  Admission Diagnoses:grade 1/2 spondylolisthesis L4-5  Discharge Diagnoses: same Active Problems:   Spondylolisthesis at L4-L5 level   Discharged Condition: good  Hospital Course: patient is [name] underwent decompressive laminectomy and fusion at L4-5 postoperatively patient did very well recovered before the floor he was angling and voiding spontaneously tolerating regular diet. Patient will be mobilized with physical therapy and pe discharge later this afternoon if cleared by therapy.  Consults: Significant Diagnostic Studies: Treatments:L4-5 PLIF Discharge Exam: Blood pressure (!) 101/55, pulse (!) 103, temperature 97.8 F (36.6 C), temperature source Oral, resp. rate 20, height 5\' 8"  (1.727 m), weight 120.2 kg (264 lb 15.9 oz), SpO2 93 %. Strength out of 5 wound clean dry and intact  Disposition: home     Medication List    TAKE these medications   atenolol 50 MG tablet Commonly known as:  TENORMIN Take 50 mg by mouth daily before breakfast.   atorvastatin 40 MG tablet Commonly known as:  LIPITOR Take 40 mg by mouth daily.   benazepril 40 MG tablet Commonly known as:  LOTENSIN Take 40 mg by mouth daily before breakfast.   CARTIA XT 240 MG 24 hr capsule Generic drug:  diltiazem Take 240 mg by mouth daily after breakfast.   cyclobenzaprine 10 MG tablet Commonly known as:  FLEXERIL Take 1 tablet (10 mg total) by mouth 3 (three) times daily as needed for muscle spasms.   diclofenac 75 MG EC tablet Commonly known as:  VOLTAREN Take 75 mg by mouth 4 (four) times daily.   ELIQUIS 5 MG Tabs tablet Generic drug:  apixaban Take 5 mg by mouth 2 (two) times daily.   furosemide 20 MG tablet Commonly known as:  LASIX Take 20 mg by mouth daily.   gabapentin 100 MG capsule Commonly known as:   NEURONTIN One capsule 3 times a day for 1 week, then take 2 capsules 3 times a day What changed:  how much to take  how to take this  when to take this  additional instructions   glipiZIDE 10 MG tablet Commonly known as:  GLUCOTROL Take 10 mg by mouth 2 (two) times daily.   Magnesium Oxide 250 MG Tabs Take 250 mg by mouth daily.   metFORMIN 1000 MG tablet Commonly known as:  GLUCOPHAGE Take 1 tablet by mouth 2 (two) times daily.   multivitamin with minerals Tabs tablet Take 1 tablet by mouth 2 (two) times daily.   oxyCODONE-acetaminophen 5-325 MG tablet Commonly known as:  PERCOCET/ROXICET Take 1-2 tablets by mouth every 4 (four) hours as needed for moderate pain.   potassium chloride 10 MEQ tablet Commonly known as:  K-DUR,KLOR-CON Take 10 mEq by mouth daily.   VISINE OP Apply 1 drop to eye daily.        Signed: Paw Karstens P 10/21/2016, 7:36 AM

## 2016-10-21 NOTE — Evaluation (Signed)
Physical Therapy Evaluation and Discharge Patient Details Name: Alejandro Vaughn MRN: [id number] DOB: 1946-01-29 Today's Date: 10/21/2016   History of Present Illness  s/p underwent decompressive laminectomy and fusion at L4-5 PMHx- Rt foot drop, CHF, obesity, DM with peripheral neuropathy    Clinical Impression  PT evaluation completed with education re: back precautions and safe use of DME completed. Patient has experience with using RW from prior hospitalizations. He deferred practicing stairs or bed mobility and we verbally reviewed safe techniques. He mobilized at a Supervision level (for safety; no physical assist). He has arranged [facility] aides for daily assist on his return home and is not concerned re: being alone at night. He described multiple friends/neighbors who will also assist him. No further PT needs at this time.    Follow Up Recommendations No PT follow up;Supervision for mobility/OOB    Equipment Recommendations  None recommended by PT    Recommendations for Other Services OT consult     Precautions / Restrictions Precautions Precautions: Fall;Back Precaution Booklet Issued: Yes (comment) Precaution Comments: Educated pt on precautions Required Braces or Orthoses: Spinal Brace Restrictions Weight Bearing Restrictions: No      Mobility  Bed Mobility               General bed mobility comments: Not observed (offered pt opportunity to practice and he declined); he described his technique (swinging leg up and over like riding a horse-??); educated pt on preferred technique verbally and with handout  Transfers Overall transfer level: Needs assistance Equipment used: Rolling walker (2 wheeled) Transfers: Sit to/from Stand Sit to Stand: Supervision         General transfer comment: x 2; pt prefers to put hands on RW handles to stabilize as he transfers; educated on safe technique and he states his way works for him;  Ambulation/Gait Ambulation/Gait  assistance: Supervision Ambulation Distance (Feet): 25 Feet (seated rest; 90) Assistive device: Rolling walker (2 wheeled) Gait Pattern/deviations: Step-through pattern;Decreased stride length;Trunk flexed;Wide base of support (knees flexed)   Gait velocity interpretation: at or above normal speed for age/gender (slightly fast for his breath support) General Gait Details: pt reports h/o Rt knee buckling with falls; agreed to use his 4 wheeled walker while back is healing (instead of furniture walking);   Stairs Stairs:  (pt deferred stating does not need to practice)          Wheelchair Mobility    Modified Rankin (Stroke Patients Only)       Balance Overall balance assessment: History of Falls;Needs assistance         Standing balance support: Bilateral upper extremity supported Standing balance-Leahy Scale: Poor                               Pertinent Vitals/Pain Pain Assessment: 0-10 Pain Score: 0-No pain    Home Living Family/patient expects to be discharged to:: Private residence Living Arrangements: Alone Available Help at Discharge: Friend(s);Personal care attendant (thru two agencies) Type of Home: House Home Access: Stairs to enter Entrance Stairs-Rails: Can reach both Entrance Stairs-Number of Steps: 2 Home Layout: One level Home Equipment: Shower seat;Wheelchair - manual;Cane - single point;Walker - 4 wheels;Grab bars - tub/shower;Hand held shower head (no seat on RW; sink by toilet; )      Prior Function Level of Independence: Independent with assistive device(s)         Comments: uses w/c in community (pushed by someone); furniture  walking; independent with ADLs; drives; uses electric cart to grocery shop     Hand Dominance        Extremity/Trunk Assessment   Upper Extremity Assessment: Defer to OT evaluation           Lower Extremity Assessment: RLE deficits/detail;LLE deficits/detail RLE Deficits / Details: Rt foot  drop (appears incomplete-he does demonstrate some DF in walking);  LLE Deficits / Details: grossly 5/5  Cervical / Trunk Assessment: Other exceptions  Communication   Communication: No difficulties  Cognition Arousal/Alertness: Awake/alert Behavior During Therapy: Impulsive (frustrated) Overall Cognitive Status: Within Functional Limits for tasks assessed                      General Comments      Exercises     Assessment/Plan    PT Assessment Patent does not need any further PT services  PT Problem List            PT Treatment Interventions      PT Goals (Current goals can be found in the Care Plan section)  Acute Rehab PT Goals Patient Stated Goal: go home today PT Goal Formulation: All assessment and education complete, DC therapy    Frequency     Barriers to discharge        Co-evaluation               End of Session Equipment Utilized During Treatment: Back brace Activity Tolerance: Patient limited by fatigue Patient left: in chair;Other (comment) (with OT present to do OT eval) Nurse Communication: Mobility status;Other (comment) (pt likely will not adhere to precautions or safe use of DME)         Time: [id number] PT Time Calculation (min) (ACUTE ONLY): 37 min   Charges:   PT Evaluation $PT Eval Low Complexity: 1 Procedure PT Treatments $Gait Training: 8-22 mins   PT G Codes:        Cyndal Kasson October 22, 2016, 12:03 PM Pager [phone]

## 2016-10-21 NOTE — Progress Notes (Signed)
Patient discharged from unit. Alert, oriented.  Drain removed without complications.  Minimal amount of bloody drainage from insertion site. Reinforced dressing.  Patient aware. Patient educated on monitoring blood pressure per MD.  Teach back awareness to hold Blood pressure medication if systolic less than [id number] per MD.  Patient verbalized understanding of this and the use of Eliquis to begin this Friday.  Demonstrated use of spirometer, and managing back pain.  Patient left unit in wheelchair accompanied by staff and family friend.

## 2016-10-21 NOTE — Care Management Note (Signed)
Case Management Note  Patient Details  Name: Alejandro Vaughn MRN: [id number] Date of Birth: 1946/12/03  Subjective/Objective:                    Action/Plan: Pt discharging home with self care. No f/u per PT. No further needs per CM.   Expected Discharge Date:                  Expected Discharge Plan:  Home/Self Care  In-House Referral:     Discharge planning Services     Post Acute Care Choice:    Choice offered to:     DME Arranged:    DME Agency:     HH Arranged:    [place] Agency:     Status of Service:  Completed, signed off  If discussed at [employer] of Stay Meetings, dates discussed:    Additional Comments:  Pollie Friar, RN 10/21/2016, 1:46 PM

## 2016-10-21 NOTE — Progress Notes (Signed)
Patient ID: Alejandro Vaughn, male   DOB: August 19, 1946, 70 y.o.   MRN: [id number] Doing well no leg pain back pain well-controlled  Strength 5 out of 5 wound clean dry and intact  Work with physical and occupational therapy today we'll plan discharge later this afternoon if he is cleared by physical therapy.

## 2016-10-21 NOTE — Anesthesia Postprocedure Evaluation (Signed)
Anesthesia Post Note  Patient: Alejandro Vaughn  Procedure(s) Performed: Procedure(s) (LRB): LUMBAR FOUR - LUMBAR FIVE POSTERIOR LUMBAR INTERBODY FUSION, POSSIBLE LUMBAR THREE - LUMBAR FOUR (N/A)  Patient location during evaluation: PACU Anesthesia Type: General Level of consciousness: awake and alert Pain management: pain level controlled Vital Signs Assessment: post-procedure vital signs reviewed and stable Respiratory status: spontaneous breathing, nonlabored ventilation, respiratory function stable and patient connected to nasal cannula oxygen Cardiovascular status: blood pressure returned to baseline and stable Postop Assessment: no signs of nausea or vomiting Anesthetic complications: no    Last Vitals:  Vitals:   10/21/16 0127 10/21/16 0600  BP: 101/63 (!) 101/55  Pulse: 95 (!) 103  Resp: 18 20  Temp: 36.4 C 36.6 C    Last Pain:  Vitals:   10/21/16 0622  TempSrc:   PainSc: 3                  Catalina Gravel
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
## 2016-11-19 ENCOUNTER — Ambulatory Visit: Payer: Self-pay | Admitting: Neurology

## 2017-01-28 ENCOUNTER — Encounter: Payer: Self-pay | Admitting: Neurology

## 2017-01-28 ENCOUNTER — Ambulatory Visit (INDEPENDENT_AMBULATORY_CARE_PROVIDER_SITE_OTHER): Payer: Medicare HMO | Admitting: Neurology

## 2017-01-28 VITALS — BP 121/75 | HR 74 | Ht 68.0 in

## 2017-01-28 DIAGNOSIS — M25562 Pain in left knee: Secondary | ICD-10-CM | POA: Diagnosis not present

## 2017-01-28 DIAGNOSIS — G8929 Other chronic pain: Secondary | ICD-10-CM | POA: Diagnosis not present

## 2017-01-28 DIAGNOSIS — M25561 Pain in right knee: Secondary | ICD-10-CM

## 2017-01-28 DIAGNOSIS — E1142 Type 2 diabetes mellitus with diabetic polyneuropathy: Secondary | ICD-10-CM

## 2017-01-28 NOTE — Patient Instructions (Signed)
   We will be making a referral to an orthopedic surgeon
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
## 2017-05-05 ENCOUNTER — Telehealth: Payer: Self-pay | Admitting: Neurology

## 2017-05-05 NOTE — Telephone Encounter (Signed)
Pt did not follow thru on the initial referral that was set up by Dr Jannifer Franklin to the [facility] but is now requesting that another be sent for an appointment, please call

## 2017-05-07 NOTE — Telephone Encounter (Signed)
Order has been sent to [facility] telephone [phone]-  Fax [phone] . Per patient's request.

## 2017-08-04 IMAGING — RF DG LUMBAR SPINE 2-3V
1 series · 2 of 2 positions shown · non-contrast
Comparison: 10/09/2016

CLINICAL DATA: L4-5 PLIF

EXAM:
LUMBAR SPINE - 2-3 VIEW; DG C-ARM 61-120 MIN

[Series 1: run · 2 of 2 slices shown]
[im 1/2]
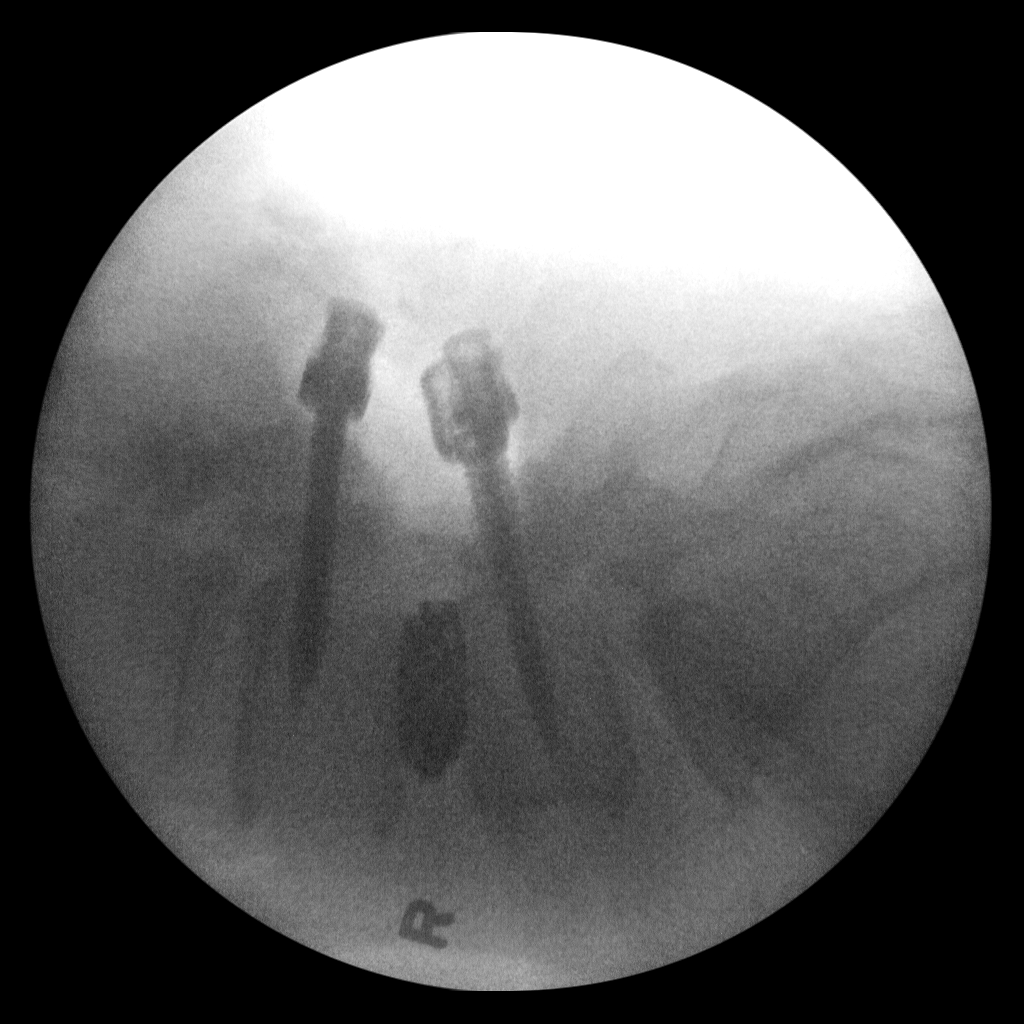
[im 2/2]
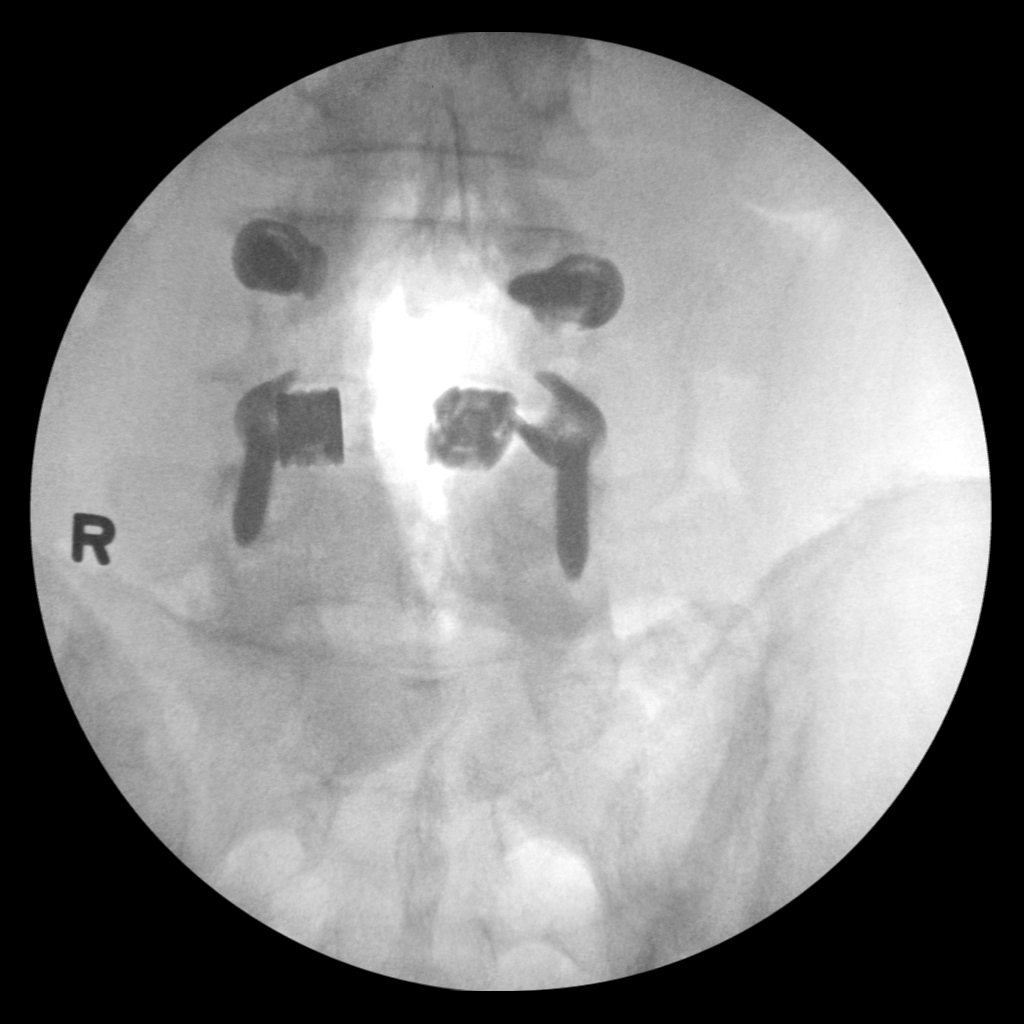

[2 of 2 positions shown; findings below may reference images not displayed]

FINDINGS: L4-5 posterior lumbar interbody fusion with located intervertebral
cage and unremarkable pedicle screws. No evidence of intraoperative
fracture.
IMPRESSION: Fluoroscopy for L4-5 PLIF.

## 2022-02-19 DEATH — deceased
